# Patient Record
Sex: Female | Born: 1965 | Hispanic: Yes | Marital: Married | Smoking: Never smoker
Health system: Southern US, Community
[De-identification: ages and names within clinical notes are randomized; demographics above are authoritative.]

## PROBLEM LIST (undated history)

## (undated) DIAGNOSIS — K5792 Diverticulitis of intestine, part unspecified, without perforation or abscess without bleeding: Secondary | ICD-10-CM

## (undated) DIAGNOSIS — E119 Type 2 diabetes mellitus without complications: Secondary | ICD-10-CM

## (undated) DIAGNOSIS — K219 Gastro-esophageal reflux disease without esophagitis: Secondary | ICD-10-CM

## (undated) DIAGNOSIS — C801 Malignant (primary) neoplasm, unspecified: Secondary | ICD-10-CM

## (undated) DIAGNOSIS — E0590 Thyrotoxicosis, unspecified without thyrotoxic crisis or storm: Secondary | ICD-10-CM

## (undated) HISTORY — DX: Thyrotoxicosis, unspecified without thyrotoxic crisis or storm: E05.90

## (undated) HISTORY — DX: Diverticulitis of intestine, part unspecified, without perforation or abscess without bleeding: K57.92

## (undated) HISTORY — DX: Gastro-esophageal reflux disease without esophagitis: K21.9

---

## 2014-12-03 ENCOUNTER — Other Ambulatory Visit (HOSPITAL_COMMUNITY): Payer: Self-pay | Admitting: Physician Assistant

## 2014-12-03 DIAGNOSIS — Z1231 Encounter for screening mammogram for malignant neoplasm of breast: Secondary | ICD-10-CM

## 2014-12-03 DIAGNOSIS — N644 Mastodynia: Secondary | ICD-10-CM

## 2014-12-08 ENCOUNTER — Encounter (HOSPITAL_COMMUNITY): Payer: Self-pay

## 2014-12-15 ENCOUNTER — Ambulatory Visit (HOSPITAL_COMMUNITY)
Admission: RE | Admit: 2014-12-15 | Discharge: 2014-12-15 | Disposition: A | Discharge status: Home / Self Care | Payer: BLUE CROSS/BLUE SHIELD | Source: Ambulatory Visit | Attending: Physician Assistant | Admitting: Physician Assistant

## 2014-12-15 ENCOUNTER — Other Ambulatory Visit (HOSPITAL_COMMUNITY): Payer: Self-pay | Admitting: Physician Assistant

## 2014-12-15 DIAGNOSIS — N644 Mastodynia: Secondary | ICD-10-CM

## 2014-12-22 ENCOUNTER — Encounter: Payer: Self-pay | Admitting: Advanced Practice Midwife

## 2014-12-22 ENCOUNTER — Other Ambulatory Visit (HOSPITAL_COMMUNITY)
Admission: RE | Admit: 2014-12-22 | Discharge: 2014-12-22 | Disposition: A | Discharge status: Home / Self Care | Payer: BLUE CROSS/BLUE SHIELD | Source: Ambulatory Visit | Attending: Advanced Practice Midwife | Admitting: Advanced Practice Midwife

## 2014-12-22 ENCOUNTER — Ambulatory Visit (INDEPENDENT_AMBULATORY_CARE_PROVIDER_SITE_OTHER): Payer: BLUE CROSS/BLUE SHIELD | Admitting: Advanced Practice Midwife

## 2014-12-22 VITALS — BP 138/80 | HR 75 | Ht 63.0 in | Wt 161.0 lb

## 2014-12-22 DIAGNOSIS — Z01419 Encounter for gynecological examination (general) (routine) without abnormal findings: Secondary | ICD-10-CM | POA: Diagnosis not present

## 2014-12-22 DIAGNOSIS — Z1151 Encounter for screening for human papillomavirus (HPV): Secondary | ICD-10-CM | POA: Insufficient documentation

## 2014-12-22 NOTE — Progress Notes (Signed)
[facility] 49 y.o.  Filed Vitals:   12/22/14 1549  BP: 138/80  Pulse: 75     Past Medical History: Past Medical History  Diagnosis Date  . Acid reflux     Past Surgical History: History reviewed. No pertinent past surgical history.  Family History: Family History  Problem Relation Age of Onset  . Cancer Father     lung cancer  . Cancer Brother     lung cancer    Social History: Social History  Substance Use Topics  . Smoking status: Never Smoker   . Smokeless tobacco: Never Used  . Alcohol Use: No    Allergies:  Allergies  Allergen Reactions  . Penicillins Itching and Rash      Current outpatient prescriptions:  .  lansoprazole (PREVACID) 15 MG capsule, Take 15 mg by mouth daily at 12 noon., Disp: , Rfl:   History of Present Illness: Here for 1st pap ever!  No kids.  Husband is decent interpreter Had normal mammogram 2 weeks ago.   Still has monthly periods, no c/o  Review of Systems   Patient denies any headaches, blurred vision, shortness of breath, chest pain, abdominal pain, problems with bowel movements, urination, or intercourse.   Physical Exam: General:  Well developed, well nourished, no acute distress Skin:  Warm and dry Neck:  Midline trachea, normal thyroid Lungs; Clear to auscultation bilaterally Breast:  No dominant palpable mass, retraction, or nipple discharge Cardiovascular: Regular rate and rhythm Abdomen:  Soft, non tender, no hepatosplenomegaly Pelvic:  External genitalia is normal in appearance.  The vagina is normal in appearance.  The cervix is smooth and nonfriable  Uterus is felt to be normal size, shape, and contour.  No adnexal masses or tenderness noted.  Extremities:  No swelling or varicosities noted Psych:  No mood changes.     Impression: normal GYN exam     Plan: If normal, q 3 years Mammograms yearly

## 2014-12-22 NOTE — Addendum Note (Signed)
Addended by: Traci Sermon A on: 12/22/2014 05:06 PM   Modules accepted: Orders

## 2014-12-24 LAB — CYTOLOGY - PAP

## 2015-06-17 ENCOUNTER — Encounter (HOSPITAL_COMMUNITY): Payer: Self-pay | Admitting: Emergency Medicine

## 2015-06-17 ENCOUNTER — Emergency Department (HOSPITAL_COMMUNITY)
Admission: EM | Admit: 2015-06-17 | Discharge: 2015-06-17 | Disposition: A | Discharge status: Home / Self Care | Payer: BLUE CROSS/BLUE SHIELD | Attending: Emergency Medicine | Admitting: Emergency Medicine

## 2015-06-17 ENCOUNTER — Emergency Department (HOSPITAL_COMMUNITY): Payer: BLUE CROSS/BLUE SHIELD

## 2015-06-17 ENCOUNTER — Other Ambulatory Visit: Payer: Self-pay

## 2015-06-17 DIAGNOSIS — R079 Chest pain, unspecified: Secondary | ICD-10-CM | POA: Diagnosis present

## 2015-06-17 DIAGNOSIS — J40 Bronchitis, not specified as acute or chronic: Secondary | ICD-10-CM | POA: Diagnosis not present

## 2015-06-17 LAB — BASIC METABOLIC PANEL
Anion gap: 8 (ref 5–15)
BUN: 10 mg/dL (ref 6–20)
CALCIUM: 8.5 mg/dL — AB (ref 8.9–10.3)
CO2: 26 mmol/L (ref 22–32)
CREATININE: 0.78 mg/dL (ref 0.44–1.00)
Chloride: 102 mmol/L (ref 101–111)
Glucose, Bld: 197 mg/dL — ABNORMAL HIGH (ref 65–99)
Potassium: 3.6 mmol/L (ref 3.5–5.1)
SODIUM: 136 mmol/L (ref 135–145)

## 2015-06-17 LAB — HEPATIC FUNCTION PANEL
ALBUMIN: 4.1 g/dL (ref 3.5–5.0)
ALT: 18 U/L (ref 14–54)
AST: 20 U/L (ref 15–41)
Alkaline Phosphatase: 55 U/L (ref 38–126)
BILIRUBIN TOTAL: 0.4 mg/dL (ref 0.3–1.2)
Bilirubin, Direct: <0.1 mg/dL — ABNORMAL LOW (ref 0.1–0.5)
TOTAL PROTEIN: 7.5 g/dL (ref 6.5–8.1)

## 2015-06-17 LAB — LIPASE, BLOOD: LIPASE: 36 U/L (ref 11–51)

## 2015-06-17 LAB — CBC
HCT: 44.2 % (ref 36.0–46.0)
Hemoglobin: 15.4 g/dL — ABNORMAL HIGH (ref 12.0–15.0)
MCH: 30.2 pg (ref 26.0–34.0)
MCHC: 34.8 g/dL (ref 30.0–36.0)
MCV: 86.7 fL (ref 78.0–100.0)
PLATELETS: 272 10*3/uL (ref 150–400)
RBC: 5.1 MIL/uL (ref 3.87–5.11)
RDW: 13.7 % (ref 11.5–15.5)
WBC: 8 10*3/uL (ref 4.0–10.5)

## 2015-06-17 LAB — TROPONIN I

## 2015-06-17 MED ORDER — AZITHROMYCIN 250 MG PO TABS
500.0000 mg | ORAL_TABLET | Freq: Once | ORAL | Status: AC
  Administered 2015-06-17: 500 mg via ORAL
  Filled 2015-06-17: qty 2

## 2015-06-17 MED ORDER — AZITHROMYCIN 250 MG PO TABS: ORAL_TABLET | ORAL | Status: DC

## 2015-06-17 MED ORDER — HYDROCODONE-ACETAMINOPHEN 5-325 MG PO TABS
1.0000 | ORAL_TABLET | Freq: Once | ORAL | Status: AC
  Administered 2015-06-17: 1 via ORAL
  Filled 2015-06-17: qty 1

## 2015-06-17 MED ORDER — HYDROCODONE-ACETAMINOPHEN 5-325 MG PO TABS: 1.0000 | ORAL_TABLET | Freq: Four times a day (QID) | ORAL | Status: DC | PRN

## 2015-06-17 NOTE — Discharge Instructions (Signed)
Follow up next week if not improving. °

## 2015-06-17 NOTE — ED Notes (Signed)
Pt alert & oriented x4, stable gait. Patient given discharge instructions, paperwork & prescription(s). Patient instructed to stop at the registration desk to finish any additional paperwork. Patient informed not to drive, operate any equipment & handel any important documents 4 hours after taking pain medication. Patient verbalized understanding. Pt left department w/ no further questions. 

## 2015-06-17 NOTE — ED Notes (Signed)
Patient complaining of "squeezing" feeling in the middle of her chest for 1 week. Patient does not speak [place] and has spouse with her to translate. Spouse states patient has been coughing starting this week.

## 2015-06-18 NOTE — ED Provider Notes (Signed)
CSN: [id number]     Arrival date & time 06/17/15  1903 History   First MD Initiated Contact with Patient 06/17/15 1956     Chief Complaint  Patient presents with  . Chest Pain     (Consider location/radiation/quality/duration/timing/severity/associated sxs/prior Treatment) Patient is a 50 y.o. female presenting with chest pain. The history is provided by the patient (Patient complains of a cough for a few days and some chest tightness).  Chest Pain Pain location:  L chest Pain quality: aching   Pain radiates to:  Does not radiate Pain radiates to the back: no   Pain severity:  Mild Onset quality:  Gradual Timing:  Intermittent Chronicity:  New Context: not breathing   Associated symptoms: cough   Associated symptoms: no abdominal pain, no back pain, no fatigue and no headache     Past Medical History  Diagnosis Date  . Acid reflux    History reviewed. No pertinent past surgical history. Family History  Problem Relation Age of Onset  . Cancer Father     lung cancer  . Cancer Brother     lung cancer   Social History  Substance Use Topics  . Smoking status: Never Smoker   . Smokeless tobacco: Never Used  . Alcohol Use: No   OB History    No data available     Review of Systems  Constitutional: Negative for appetite change and fatigue.  HENT: Negative for congestion, ear discharge and sinus pressure.   Eyes: Negative for discharge.  Respiratory: Positive for cough.   Cardiovascular: Positive for chest pain.  Gastrointestinal: Negative for abdominal pain and diarrhea.  Genitourinary: Negative for frequency and hematuria.  Musculoskeletal: Negative for back pain.  Skin: Negative for rash.  Neurological: Negative for seizures and headaches.  Psychiatric/Behavioral: Negative for hallucinations.      Allergies  Penicillins  Home Medications   Prior to Admission medications   Medication Sig Start Date End Date Taking? Authorizing Provider  azithromycin  (ZITHROMAX) 250 MG tablet Take one tablet a day 06/17/15   Milton Ferguson, MD  HYDROcodone-acetaminophen (NORCO/VICODIN) 5-325 MG tablet Take 1 tablet by mouth every 6 (six) hours as needed for moderate pain. 06/17/15   Milton Ferguson, MD   BP 112/72 mmHg  Pulse 85  Temp(Src) 99.6 F (37.6 C) (Oral)  Resp 19  Ht 5\' 3"  (1.6 m)  Wt 161 lb (73.029 kg)  BMI 28.53 kg/m2  SpO2 95%  LMP 06/07/2015 Physical Exam  Constitutional: She is oriented to person, place, and time. She appears well-developed.  HENT:  Head: Normocephalic.  Eyes: Conjunctivae and EOM are normal. No scleral icterus.  Neck: Neck supple. No thyromegaly present.  Cardiovascular: Normal rate and regular rhythm.  Exam reveals no gallop and no friction rub.   No murmur heard. Pulmonary/Chest: No stridor. She has no wheezes. She has no rales. She exhibits no tenderness.  Abdominal: She exhibits no distension. There is no tenderness. There is no rebound.  Musculoskeletal: Normal range of motion. She exhibits no edema.  Lymphadenopathy:    She has no cervical adenopathy.  Neurological: She is oriented to person, place, and time. She exhibits normal muscle tone. Coordination normal.  Skin: No rash noted. No erythema.  Psychiatric: She has a normal mood and affect. Her behavior is normal.    ED Course  Procedures (including critical care time) Labs Review Labs Reviewed  CBC - Abnormal; Notable for the following:    Hemoglobin 15.4 (*)    All other  components within normal limits  BASIC METABOLIC PANEL - Abnormal; Notable for the following:    Glucose, Bld 197 (*)    Calcium 8.5 (*)    All other components within normal limits  HEPATIC FUNCTION PANEL - Abnormal; Notable for the following:    Bilirubin, Direct <0.1 (*)    All other components within normal limits  TROPONIN I  LIPASE, BLOOD    Imaging Review Dg Chest 2 View  06/17/2015  CLINICAL DATA:  Chest pain EXAM: CHEST  2 VIEW COMPARISON:  None. FINDINGS: Normal  heart size. Normal mediastinal contour. No pneumothorax. No pleural effusion. Lungs appear clear, with no acute consolidative airspace disease and no pulmonary edema. IMPRESSION: No active cardiopulmonary disease. Electronically Signed   By: Ilona Sorrel M.D.   On: 06/17/2015 19:59   I have personally reviewed and evaluated these images and lab results as part of my medical decision-making.   EKG Interpretation   Date/Time:  Thursday June 17 2015 20:05:22 EDT Ventricular Rate:  91 PR Interval:  152 QRS Duration: 76 QT Interval:  346 QTC Calculation: 426 R Axis:   -48 Text Interpretation:  Sinus rhythm Left axis deviation Baseline wander in  lead(s) V3 Confirmed by Vaughn Frieze  MD, Caliya Narine ([id number]) on 06/17/2015 10:01:29  PM      MDM   Final diagnoses:  Bronchitis    Labs unremarkable. Suspect chest pain is related to bronchitis. Patient put on Z-Pak and will follow-up as needed    Milton Ferguson, MD 06/18/15 2137

## 2018-10-02 ENCOUNTER — Other Ambulatory Visit: Payer: Self-pay

## 2018-10-02 ENCOUNTER — Other Ambulatory Visit: Payer: BLUE CROSS/BLUE SHIELD

## 2018-10-02 DIAGNOSIS — R6889 Other general symptoms and signs: Secondary | ICD-10-CM | POA: Diagnosis not present

## 2018-10-02 DIAGNOSIS — Z20822 Contact with and (suspected) exposure to covid-19: Secondary | ICD-10-CM

## 2018-10-06 LAB — NOVEL CORONAVIRUS, NAA: SARS-CoV-2, NAA: NOT DETECTED

## 2018-10-10 ENCOUNTER — Telehealth: Payer: Self-pay | Admitting: Physician Assistant

## 2018-10-10 NOTE — Telephone Encounter (Signed)
Pt aware covid lab test negative, not detected  Pt would like the generic letter about her covid test results mailed to her home  [place] [id number]

## 2018-10-10 NOTE — Telephone Encounter (Signed)
Results will be mailed to address as requested.

## 2019-07-25 DIAGNOSIS — G44229 Chronic tension-type headache, not intractable: Secondary | ICD-10-CM | POA: Diagnosis not present

## 2019-07-25 DIAGNOSIS — E663 Overweight: Secondary | ICD-10-CM | POA: Diagnosis not present

## 2019-07-25 DIAGNOSIS — Z6828 Body mass index (BMI) 28.0-28.9, adult: Secondary | ICD-10-CM | POA: Diagnosis not present

## 2019-07-25 DIAGNOSIS — Z0001 Encounter for general adult medical examination with abnormal findings: Secondary | ICD-10-CM | POA: Diagnosis not present

## 2019-08-27 DIAGNOSIS — R42 Dizziness and giddiness: Secondary | ICD-10-CM | POA: Diagnosis not present

## 2019-08-27 DIAGNOSIS — Z0001 Encounter for general adult medical examination with abnormal findings: Secondary | ICD-10-CM | POA: Diagnosis not present

## 2019-08-27 DIAGNOSIS — E663 Overweight: Secondary | ICD-10-CM | POA: Diagnosis not present

## 2019-08-27 DIAGNOSIS — Z6828 Body mass index (BMI) 28.0-28.9, adult: Secondary | ICD-10-CM | POA: Diagnosis not present

## 2019-09-05 DIAGNOSIS — E119 Type 2 diabetes mellitus without complications: Secondary | ICD-10-CM | POA: Diagnosis not present

## 2019-09-05 DIAGNOSIS — Z124 Encounter for screening for malignant neoplasm of cervix: Secondary | ICD-10-CM | POA: Diagnosis not present

## 2019-09-05 DIAGNOSIS — Z01411 Encounter for gynecological examination (general) (routine) with abnormal findings: Secondary | ICD-10-CM | POA: Diagnosis not present

## 2019-12-18 DIAGNOSIS — E7849 Other hyperlipidemia: Secondary | ICD-10-CM | POA: Diagnosis not present

## 2019-12-18 DIAGNOSIS — Z6827 Body mass index (BMI) 27.0-27.9, adult: Secondary | ICD-10-CM | POA: Diagnosis not present

## 2019-12-18 DIAGNOSIS — E119 Type 2 diabetes mellitus without complications: Secondary | ICD-10-CM | POA: Diagnosis not present

## 2020-02-06 DIAGNOSIS — E7849 Other hyperlipidemia: Secondary | ICD-10-CM | POA: Diagnosis not present

## 2020-02-06 DIAGNOSIS — E119 Type 2 diabetes mellitus without complications: Secondary | ICD-10-CM | POA: Diagnosis not present

## 2020-02-06 DIAGNOSIS — Z6827 Body mass index (BMI) 27.0-27.9, adult: Secondary | ICD-10-CM | POA: Diagnosis not present

## 2020-03-26 DIAGNOSIS — U071 COVID-19: Secondary | ICD-10-CM | POA: Diagnosis not present

## 2020-03-26 DIAGNOSIS — Z20822 Contact with and (suspected) exposure to covid-19: Secondary | ICD-10-CM | POA: Diagnosis not present

## 2020-04-20 ENCOUNTER — Other Ambulatory Visit: Payer: Self-pay

## 2020-04-20 ENCOUNTER — Ambulatory Visit: Admission: EM | Admit: 2020-04-20 | Discharge: 2020-04-20 | Discharge status: Absent without leave | Payer: Self-pay

## 2020-04-21 DIAGNOSIS — Z1331 Encounter for screening for depression: Secondary | ICD-10-CM | POA: Diagnosis not present

## 2020-04-21 DIAGNOSIS — Z6825 Body mass index (BMI) 25.0-25.9, adult: Secondary | ICD-10-CM | POA: Diagnosis not present

## 2020-04-21 DIAGNOSIS — R197 Diarrhea, unspecified: Secondary | ICD-10-CM | POA: Diagnosis not present

## 2020-04-21 DIAGNOSIS — K529 Noninfective gastroenteritis and colitis, unspecified: Secondary | ICD-10-CM | POA: Diagnosis not present

## 2020-04-21 DIAGNOSIS — E663 Overweight: Secondary | ICD-10-CM | POA: Diagnosis not present

## 2020-05-10 ENCOUNTER — Encounter: Payer: Self-pay | Admitting: Internal Medicine

## 2020-05-21 DIAGNOSIS — Z6824 Body mass index (BMI) 24.0-24.9, adult: Secondary | ICD-10-CM | POA: Diagnosis not present

## 2020-05-21 DIAGNOSIS — E7849 Other hyperlipidemia: Secondary | ICD-10-CM | POA: Diagnosis not present

## 2020-05-21 DIAGNOSIS — K529 Noninfective gastroenteritis and colitis, unspecified: Secondary | ICD-10-CM | POA: Diagnosis not present

## 2020-05-21 DIAGNOSIS — E119 Type 2 diabetes mellitus without complications: Secondary | ICD-10-CM | POA: Diagnosis not present

## 2020-05-21 DIAGNOSIS — R12 Heartburn: Secondary | ICD-10-CM | POA: Diagnosis not present

## 2020-05-21 DIAGNOSIS — E739 Lactose intolerance, unspecified: Secondary | ICD-10-CM | POA: Diagnosis not present

## 2020-06-11 DIAGNOSIS — Z6823 Body mass index (BMI) 23.0-23.9, adult: Secondary | ICD-10-CM | POA: Diagnosis not present

## 2020-06-11 DIAGNOSIS — R1012 Left upper quadrant pain: Secondary | ICD-10-CM | POA: Diagnosis not present

## 2020-06-11 DIAGNOSIS — R634 Abnormal weight loss: Secondary | ICD-10-CM | POA: Diagnosis not present

## 2020-06-11 DIAGNOSIS — R195 Other fecal abnormalities: Secondary | ICD-10-CM | POA: Diagnosis not present

## 2020-06-15 ENCOUNTER — Other Ambulatory Visit: Payer: Self-pay

## 2020-06-15 ENCOUNTER — Encounter (HOSPITAL_COMMUNITY): Payer: Self-pay | Admitting: *Deleted

## 2020-06-15 ENCOUNTER — Emergency Department (HOSPITAL_COMMUNITY): Payer: BC Managed Care – PPO

## 2020-06-15 ENCOUNTER — Emergency Department (HOSPITAL_COMMUNITY)
Admission: EM | Admit: 2020-06-15 | Discharge: 2020-06-15 | Disposition: A | Discharge status: Home / Self Care | Payer: BC Managed Care – PPO | Attending: Emergency Medicine | Admitting: Emergency Medicine

## 2020-06-15 DIAGNOSIS — R109 Unspecified abdominal pain: Secondary | ICD-10-CM | POA: Diagnosis not present

## 2020-06-15 DIAGNOSIS — E119 Type 2 diabetes mellitus without complications: Secondary | ICD-10-CM | POA: Insufficient documentation

## 2020-06-15 DIAGNOSIS — K5792 Diverticulitis of intestine, part unspecified, without perforation or abscess without bleeding: Secondary | ICD-10-CM | POA: Diagnosis not present

## 2020-06-15 HISTORY — DX: Type 2 diabetes mellitus without complications: E11.9

## 2020-06-15 LAB — COMPREHENSIVE METABOLIC PANEL
ALT: 19 U/L (ref 0–44)
AST: 13 U/L — ABNORMAL LOW (ref 15–41)
Albumin: 3.9 g/dL (ref 3.5–5.0)
Alkaline Phosphatase: 85 U/L (ref 38–126)
Anion gap: 10 (ref 5–15)
BUN: 12 mg/dL (ref 6–20)
CO2: 26 mmol/L (ref 22–32)
Calcium: 10.6 mg/dL — ABNORMAL HIGH (ref 8.9–10.3)
Chloride: 105 mmol/L (ref 98–111)
Creatinine, Ser: 0.38 mg/dL — ABNORMAL LOW (ref 0.44–1.00)
GFR, Estimated: >60 mL/min (ref >60)
Glucose, Bld: 121 mg/dL — ABNORMAL HIGH (ref 70–99)
Potassium: 3.5 mmol/L (ref 3.5–5.1)
Sodium: 141 mmol/L (ref 135–145)
Total Bilirubin: 0.5 mg/dL (ref 0.3–1.2)
Total Protein: 7.5 g/dL (ref 6.5–8.1)

## 2020-06-15 LAB — CBC WITH DIFFERENTIAL/PLATELET
Abs Immature Granulocytes: 0.04 10*3/uL (ref 0.00–0.07)
Basophils Absolute: 0 10*3/uL (ref 0.0–0.1)
Basophils Relative: 0 %
Eosinophils Absolute: 0.3 10*3/uL (ref 0.0–0.5)
Eosinophils Relative: 2 %
HCT: 41.5 % (ref 36.0–46.0)
Hemoglobin: 14 g/dL (ref 12.0–15.0)
Immature Granulocytes: 0 %
Lymphocytes Relative: 22 %
Lymphs Abs: 2.9 10*3/uL (ref 0.7–4.0)
MCH: 27.7 pg (ref 26.0–34.0)
MCHC: 33.7 g/dL (ref 30.0–36.0)
MCV: 82 fL (ref 80.0–100.0)
Monocytes Absolute: 1.1 10*3/uL — ABNORMAL HIGH (ref 0.1–1.0)
Monocytes Relative: 8 %
Neutro Abs: 8.4 10*3/uL — ABNORMAL HIGH (ref 1.7–7.7)
Neutrophils Relative %: 68 %
Platelets: 298 10*3/uL (ref 150–400)
RBC: 5.06 MIL/uL (ref 3.87–5.11)
RDW: 13 % (ref 11.5–15.5)
WBC: 12.7 10*3/uL — ABNORMAL HIGH (ref 4.0–10.5)
nRBC: 0 % (ref 0.0–0.2)

## 2020-06-15 LAB — URINALYSIS, ROUTINE W REFLEX MICROSCOPIC
Bilirubin Urine: NEGATIVE
Glucose, UA: NEGATIVE mg/dL
Ketones, ur: NEGATIVE mg/dL
Leukocytes,Ua: NEGATIVE
Nitrite: NEGATIVE
Protein, ur: NEGATIVE mg/dL
Specific Gravity, Urine: 1.005 (ref 1.005–1.030)
pH: 8 (ref 5.0–8.0)

## 2020-06-15 LAB — LIPASE, BLOOD: Lipase: 32 U/L (ref 11–51)

## 2020-06-15 MED ORDER — SODIUM CHLORIDE 0.9 % IV BOLUS
500.0000 mL | Freq: Once | INTRAVENOUS | Status: AC
  Administered 2020-06-15: 500 mL via INTRAVENOUS

## 2020-06-15 MED ORDER — CEFDINIR 300 MG PO CAPS: 300.0000 mg | ORAL_CAPSULE | Freq: Two times a day (BID) | ORAL | 0 refills | Status: DC

## 2020-06-15 MED ORDER — IOHEXOL 300 MG/ML  SOLN
100.0000 mL | Freq: Once | INTRAMUSCULAR | Status: AC | PRN
  Administered 2020-06-15: 100 mL via INTRAVENOUS

## 2020-06-15 MED ORDER — METRONIDAZOLE 500 MG PO TABS: 500.0000 mg | ORAL_TABLET | Freq: Two times a day (BID) | ORAL | 0 refills | Status: DC

## 2020-06-15 NOTE — Discharge Instructions (Addendum)
There is also a cyst on her ovary.  Follow-up in 6 to 12 months for another ultrasound.  Diverticulitis showed up on the CAT scan.  Take the new antibiotics and follow-up with gastroenterology as planned

## 2020-06-15 NOTE — ED Notes (Signed)
Discharge instructions reviewed with patient, interpreter [facility] [phone]. Pt verbalized understanding.

## 2020-06-15 NOTE — ED Triage Notes (Signed)
Interpreter used # [id number], Juan  Pt c/o diarrhea and LLQ abdominal pain that started 2 months ago. Pt reports she was placed on medication but it hasn't helped. Pt also reports 28lb weight loss since this started.

## 2020-06-15 NOTE — ED Provider Notes (Signed)
[facility] Provider Note   CSN: [id number] Arrival date & time: 06/15/20  0102     History Chief Complaint  Patient presents with  . Abdominal Pain    Betty Ellis is a 55 y.o. female.  HPI Patient speaks Spanish and Child psychotherapist was used. Patient presents with abdominal pain.  Has had for the last 3 months.  Started with just diarrhea and constipation at times for about a month and then for the last 2 months has had abdominal pain.  Left lower quadrant pain.  Whenever she eats she states she will have quick bowel movement after.  States she is down around 28 pounds since December.  Has seen her PCP and been on Cipro Flagyl and then Bentyl.  States that she did have Covid back in January.    Past Medical History:  Diagnosis Date  . Acid reflux   . Diabetes mellitus without complication ([place])     There are no problems to display for this patient.   History reviewed. No pertinent surgical history.   OB History   No obstetric history on file.     Family History  Problem Relation Age of Onset  . Cancer Father        lung cancer  . Cancer Brother        lung cancer    Social History   Tobacco Use  . Smoking status: Never Smoker  . Smokeless tobacco: Never Used  Vaping Use  . Vaping Use: Never used  Substance Use Topics  . Alcohol use: No  . Drug use: No    Home Medications Prior to Admission medications   Medication Sig Start Date End Date Taking? Authorizing Provider  cefdinir (OMNICEF) 300 MG capsule Take 1 capsule (300 mg total) by mouth 2 (two) times daily. 06/15/20  Yes Davonna Belling, MD  dicyclomine (BENTYL) 20 MG tablet Take 20 mg by mouth 4 (four) times daily. 06/11/20  Yes [provider]  metroNIDAZOLE (FLAGYL) 500 MG tablet Take 1 tablet (500 mg total) by mouth 2 (two) times daily. 06/15/20  Yes Davonna Belling, MD  HYDROcodone-acetaminophen (NORCO/VICODIN) 5-325 MG tablet Take 1 tablet by mouth every 6  (six) hours as needed for moderate pain. Patient not taking: Reported on 06/15/2020 06/17/15   Milton Ferguson, MD    Allergies    Fish allergy and Penicillins  Review of Systems   Review of Systems  Constitutional: Positive for appetite change and unexpected weight change.  HENT: Negative for congestion.   Respiratory: Negative for shortness of breath.   Cardiovascular: Negative for chest pain.  Gastrointestinal: Positive for abdominal pain and diarrhea. Negative for nausea and vomiting.  Genitourinary: Negative for flank pain.  Musculoskeletal: Negative for back pain.  Skin: Negative for wound.  Neurological: Negative for weakness.  Psychiatric/Behavioral: Negative for confusion.    Physical Exam Updated Vital Signs BP (!) 141/68 (BP Location: Left Arm)   Pulse 74   Temp 98 F (36.7 C) (Oral)   Resp 18   Ht 5\' 3"  (1.6 m)   Wt 60.3 kg   LMP 06/07/2015   SpO2 98%   BMI 23.56 kg/m   Physical Exam Vitals and nursing note reviewed.  HENT:     Head: Normocephalic.  Eyes:     General: No scleral icterus. Cardiovascular:     Rate and Rhythm: Normal rate.  Pulmonary:     Breath sounds: No wheezing or rhonchi.  Abdominal:     Tenderness: There  is abdominal tenderness.     Comments: Moderate left lower quadrant tenderness without hernia palpated.  Skin:    General: Skin is warm.     Capillary Refill: Capillary refill takes less than 2 seconds.  Neurological:     Mental Status: She is alert and oriented to person, place, and time.  Psychiatric:        Mood and Affect: Mood normal.     ED Results / Procedures / Treatments   Labs (all labs ordered are listed, but only abnormal results are displayed) Labs Reviewed  COMPREHENSIVE METABOLIC PANEL - Abnormal; Notable for the following components:      Result Value   Glucose, Bld 121 (*)    Creatinine, Ser 0.38 (*)    Calcium 10.6 (*)    AST 13 (*)    All other components within normal limits  CBC WITH  DIFFERENTIAL/PLATELET - Abnormal; Notable for the following components:   WBC 12.7 (*)    Neutro Abs 8.4 (*)    Monocytes Absolute 1.1 (*)    All other components within normal limits  URINALYSIS, ROUTINE W REFLEX MICROSCOPIC - Abnormal; Notable for the following components:   Color, Urine STRAW (*)    APPearance HAZY (*)    Hgb urine dipstick SMALL (*)    Bacteria, UA RARE (*)    All other components within normal limits  LIPASE, BLOOD    EKG None  Radiology CT ABDOMEN PELVIS W CONTRAST  Result Date: 06/15/2020 CLINICAL DATA:  Lower abdominal pain for 2 months with diarrhea. Left lower quadrant pain EXAM: CT ABDOMEN AND PELVIS WITH CONTRAST TECHNIQUE: Multidetector CT imaging of the abdomen and pelvis was performed using the standard protocol following bolus administration of intravenous contrast. CONTRAST:  156mL OMNIPAQUE IOHEXOL 300 MG/ML  SOLN COMPARISON:  None. FINDINGS: Lower chest:  No contributory findings. Hepatobiliary: No focal liver abnormality.No evidence of biliary obstruction or stone. Pancreas: Unremarkable. Spleen: Unremarkable. Adrenals/Urinary Tract: Negative adrenals. No hydronephrosis or stone. Unremarkable bladder. Stomach/Bowel: Distal colonic diverticulosis with mild inflammation around a distal descending sigmoid junction diverticulum which projects superiorly and may have mucosal hyperenhancement. No abscess. Vascular/Lymphatic: No acute vascular abnormality. No mass or adenopathy. Reproductive:3.5 cm right ovarian cystic density. Fatty mass in the left ovary measuring 1 cm, consistent with dermoid Other: No ascites or pneumoperitoneum. Musculoskeletal: No acute abnormalities. IMPRESSION: 1. Mild diverticulitis of the distal descending colon. 2. 3.5 cm right ovarian cyst. Assuming postmenopausal status, recommend follow-up US in 6-12 months. note: This recommendation does not apply to premenarchal patients and to those with increased risk (genetic, family history,  elevated tumor markers or other high-risk factors) of ovarian cancer. Reference: JACR 2020 Feb; 17(2):248-254 3. 1 cm left ovarian dermoid. Electronically Signed   By: Monte Fantasia M.D.   On: 06/15/2020 09:16    Procedures Procedures   Medications Ordered in ED Medications  sodium chloride 0.9 % bolus 500 mL (0 mLs Intravenous Stopped 06/15/20 1002)  iohexol (OMNIPAQUE) 300 MG/ML solution 100 mL (100 mLs Intravenous Contrast Given 06/15/20 0856)    ED Course  I have reviewed the triage vital signs and the nursing notes.  Pertinent labs & imaging results that were available during my care of the patient were reviewed by me and considered in my medical decision making (see chart for details).    MDM Rules/Calculators/A&P                          Patient  with abdominal pain.  Has had for months now.  Has lost weight with it.  Had been on Cipro and Flagyl without relief.  Lab work reassuring.  CT scan done and showed diverticulitis and incidental ovarian cyst.  Instructed to follow-up on the ovarian cyst.  I think patient is stable for outpatient follow-up.  In fact has GI follow-up in 2 days.  Discussed with pharmacy since patient has already been on Cipro Flagyl and has a penicillin allergy will do cefdinir and Flagyl.  Discharge home. Final Clinical Impression(s) / ED Diagnoses Final diagnoses:  Diverticulitis    Rx / DC Orders ED Discharge Orders         Ordered    cefdinir (OMNICEF) 300 MG capsule  2 times daily        06/15/20 1126    metroNIDAZOLE (FLAGYL) 500 MG tablet  2 times daily        06/15/20 1126           Davonna Belling, MD 06/16/20 [phone]

## 2020-06-17 ENCOUNTER — Ambulatory Visit (INDEPENDENT_AMBULATORY_CARE_PROVIDER_SITE_OTHER): Payer: BC Managed Care – PPO | Admitting: Nurse Practitioner

## 2020-06-17 ENCOUNTER — Encounter: Payer: Self-pay | Admitting: Nurse Practitioner

## 2020-06-17 ENCOUNTER — Other Ambulatory Visit: Payer: Self-pay

## 2020-06-17 DIAGNOSIS — K5900 Constipation, unspecified: Secondary | ICD-10-CM

## 2020-06-17 DIAGNOSIS — K5732 Diverticulitis of large intestine without perforation or abscess without bleeding: Secondary | ICD-10-CM | POA: Diagnosis not present

## 2020-06-17 DIAGNOSIS — R110 Nausea: Secondary | ICD-10-CM | POA: Diagnosis not present

## 2020-06-17 DIAGNOSIS — R109 Unspecified abdominal pain: Secondary | ICD-10-CM | POA: Insufficient documentation

## 2020-06-17 DIAGNOSIS — R1032 Left lower quadrant pain: Secondary | ICD-10-CM

## 2020-06-17 DIAGNOSIS — R634 Abnormal weight loss: Secondary | ICD-10-CM

## 2020-06-17 DIAGNOSIS — R197 Diarrhea, unspecified: Secondary | ICD-10-CM

## 2020-06-17 MED ORDER — ONDANSETRON HCL 4 MG PO TABS: 4.0000 mg | ORAL_TABLET | Freq: Four times a day (QID) | ORAL | 2 refills | Status: DC | PRN

## 2020-06-17 NOTE — Patient Instructions (Signed)
English:  Your health issues we discussed today were:   Diverticulitis with abdominal pain and diarrhea: 1. I am glad you are feeling better! 2. Finish all of your antibiotics 3. Call us if you have any worsening symptoms  Nausea: 1. I have sent a prescription for Zofran to your pharmacy 2. You can take this every 6 hours, if needed, for nausea 3. Call us if your symptoms get worse  Constipation: 1. Continue to use Metamucil 2. Call us if your constipation becomes worse  GERD (reflux/heartburn): 1. Continue using your acid pill to help with your reflux/heartburn 2. Call us if your symptoms become worse  Need for a colonoscopy: 1. As we discussed, you will need a colonoscopy 2. We will help schedule your colonoscopy for you 3. We will make more recommendations after your colonoscopy  Overall I recommend:  1. Continue your other current medications 2. Return for follow-up in 3 months 3. Call us if you have any questions or concerns   ---------------------------------------------------------------  I am glad you have gotten your COVID-19 vaccination!  Even though you are fully vaccinated you should continue to follow CDC and state/local guidelines.  ---------------------------------------------------------------  At [facility] we value your feedback. You may receive a survey about your visit today. Please share your experience as we strive to create trusting relationships with our patients to provide genuine, compassionate, quality care.  We appreciate your understanding and patience as we review any laboratory studies, imaging, and other diagnostic tests that are ordered as we care for you. Our office policy is 5 business days for review of these results, and any emergent or urgent results are addressed in a timely manner for your best interest. If you do not hear from our office in 1 week, please contact us.   We also encourage the use of MyChart, which  contains your medical information for your review as well. If you are not enrolled in this feature, an access code is on this after visit summary for your convenience. Thank you for allowing [place] to be involved in your care.  It was great to see you today!  I hope you have a great spring!!     Espanol:  Sus problemas de salud que discutimos hoy fueron:  Diverticulitis con dolor abdominal y diarrea: 1. Me alegro de que te sientas mejor! 2. Termine todos sus antibiticos 3. Llmenos si tiene algn sntoma que empeora  Nusea: 1. He enviado una receta de Zofran a su farmacia. 2. Puede tomar esto cada 6 horas, si es necesario, para las nuseas. 3. Llmenos si sus sntomas empeoran  Estreimiento: 1. Siga usando Metamucil 2. Llmenos si su estreimiento empeora  ERGE (reflujo/acidez estomacal): 1. Contine usando su pldora de cido para ayudar con su reflujo/acidez estomacal 2. Llmenos si sus sntomas empeoran  Necesidad de una colonoscopia: 1. Como comentamos, necesitar una colonoscopia 2. Le ayudaremos a programar su colonoscopia 3. Haremos ms recomendaciones despus de su colonoscopia.  En general recomiendo: 1. Contine con sus otros medicamentos actuales 2. Regreso para seguimiento en 3 meses 3. Llmenos si tiene alguna pregunta o inquietud.    ---------------------------------------------------------------  Me alegro de que haya recibido su vacuna COVID-19! Aunque est completamente vacunado, debe continuar siguiendo las pautas estatales/locales y [employer].  ---------------------------------------------------------------  [facility] valoramos sus comentarios. Es posible que reciba una encuesta sobre su visita hoy. Comparta su experiencia mientras nos esforzamos por crear relaciones de confianza con nuestros pacientes para brindar atencin [place], [place] y  de calidad.  Agradecemos su comprensin y [employer] estudios  de laboratorio, las imgenes y Scientist, research (medical) pruebas de diagnstico que se solicitan mientras lo cuidamos. Nuestra poltica de oficina es de 5 das hbiles para la revisin de [place], y cualquier resultado emergente o urgente se aborda de manera oportuna para su mejor inters. Si no recibe noticias de nuestra oficina en 1 semana, comunquese con nosotros.  Tambin recomendamos el uso de MyChart, que tambin contiene su informacin mdica para su revisin. Si no est inscrito en esta funcin, hay un cdigo de acceso en este resumen posterior a la visita para su conveniencia. Gracias por permitirnos estar involucrados en su cuidado.  Fue genial verte hoy! Espero que tengis una gran primavera!!

## 2020-06-17 NOTE — Progress Notes (Signed)
Primary Care Physician:  Scherrie Bateman Primary Gastroenterologist:  Dr. Gala Romney  Chief Complaint  Patient presents with  . Diverticulitis    Diagnosed 3/29 by ED. No abd pain. Currently on ABX from ER.  . change in bowels    Was having a lot of diarrhea several times a day. Now not so much. Feels maybe constipated. Strains at times with BM, BM's 1-3 times per day, no bleeding  . loss of weight    Had diarrhea for a while and everything she ate went through her. Lost 32lbs since december    HPI:   Betty Ellis is a 55 y.o. female who presents on referral from primary care for diarrhea and to schedule a colonoscopy.  Reviewed information provided with referral including PCP office visit dated 04/21/2020.  That time patient was having diarrhea and abdominal pain for about 1 month.  Recommend CBC, CMP, stool studies.  Started on Cipro and referred to GI.  Reviewed labs provided by primary care drawn 04/21/2020 which found normal CBC essentially normal CMP.  Follow-up primary care office visit 05/21/2020 noted losing weight and continued diarrhea.  Recommended referral to GI.  She did recently also have COVID-19.  Unable to tolerate milk products.  She was started on Cipro and Flagyl at that time.  She was seen in emergency department 06/15/2020 for abdominal pain for the past 3 months.  She started with diarrhea and constipation at times per month that has been ongoing.  Postprandial fecal urgency.  Weight loss of 20 pounds subjectively.  CBC did find mild leukocytosis of white blood cell count 12.7, normal hemoglobin.  CT abdomen and pelvis found diverticulitis and incidental ovarian cyst.  Recommend follow-up on ovarian cyst as outpatient.  Because of recent Cipro Flagyl has changed her to cefdinir and Flagyl, discharged home in satisfactory condition.  No history of colonoscopy found in our system.  Today states doing okay overall. She is Spanish speaking and accompanied by an  interpreter. Abdominal pain improved on new antibiotics, only hurts if she presses on it now. Has been having diarrhea though this is improved currently. Having 3-4 bowel movements a day, has normalized in consistency in the past couple days. She has lost about 32 lbs since December. Appetite is minimal (eats small portions) because she gets full early. She has diabetes for about a year. Blood sugars are about 120s-130s, not requirng medication at this time. Has nausea ongoing, thinks its due to her antibiotics even though taking it with food. No vomiting. Would like something for nausea. Denies hematochezia, melena, fever, chills. Denies URI or flu-like symptoms. Denies loss of sense of taste or smell. She has had COVID-190 in January despite vaccine. The patient has received COVID-19 vaccination(s). They have had a booster dose as well. Denies chest pain, dyspnea, dizziness, lightheadedness, syncope, near syncope. Denies any other upper or lower GI symptoms.  Has been having more constipation recently. Has GERD which does well on PPI. She is taking Metamucil for constipation, which keeps things under control.  Past Medical History:  Diagnosis Date  . Acid reflux   . Diabetes mellitus without complication ([place])     History reviewed. No pertinent surgical history.  Current Outpatient Medications  Medication Sig Dispense Refill  . cefdinir (OMNICEF) 300 MG capsule Take 1 capsule (300 mg total) by mouth 2 (two) times daily. 20 capsule 0  . lansoprazole (PREVACID) 15 MG capsule Take 15 mg by mouth daily at 12 noon.    Marland Kitchen  METAMUCIL FIBER PO Take by mouth daily.    . metroNIDAZOLE (FLAGYL) 500 MG tablet Take 1 tablet (500 mg total) by mouth 2 (two) times daily. 20 tablet 0  . ondansetron (ZOFRAN) 4 MG tablet Take 1 tablet (4 mg total) by mouth 4 (four) times daily as needed for nausea, vomiting or refractory nausea / vomiting. 30 tablet 2  . dicyclomine (BENTYL) 20 MG tablet Take 20 mg by mouth 4 (four)  times daily. (Patient not taking: Reported on 06/17/2020)    . HYDROcodone-acetaminophen (NORCO/VICODIN) 5-325 MG tablet Take 1 tablet by mouth every 6 (six) hours as needed for moderate pain. (Patient not taking: No sig reported) 20 tablet 0   No current facility-administered medications for this visit.    Allergies as of 06/17/2020 - Review Complete 06/17/2020  Allergen Reaction Noted  . Fish allergy Hives 06/15/2020  . Penicillins Itching and Rash 12/22/2014    Family History  Problem Relation Age of Onset  . Cancer Father        lung cancer  . Cancer Brother        lung cancer    Social History   Socioeconomic History  . Marital status: Married    Spouse name: Not on file  . Number of children: Not on file  . Years of education: Not on file  . Highest education level: Not on file  Occupational History  . Not on file  Tobacco Use  . Smoking status: Never Smoker  . Smokeless tobacco: Never Used  Vaping Use  . Vaping Use: Never used  Substance and Sexual Activity  . Alcohol use: No  . Drug use: No  . Sexual activity: Yes    Birth control/protection: None  Other Topics Concern  . Not on file  Social History Narrative  . Not on file   Social Determinants of Health   Financial Resource Strain: Not on file  Food Insecurity: Not on file  Transportation Needs: Not on file  Physical Activity: Not on file  Stress: Not on file  Social Connections: Not on file  Intimate Partner Violence: Not on file    Subjective: Review of Systems  Constitutional: Negative for chills, fever, malaise/fatigue and weight loss.  HENT: Negative for congestion and sore throat.   Respiratory: Negative for cough and shortness of breath.   Cardiovascular: Negative for chest pain and palpitations.  Gastrointestinal: Positive for abdominal pain (Improved), constipation, diarrhea and nausea. Negative for blood in stool, melena and vomiting.  Musculoskeletal: Negative for joint pain and  myalgias.  Skin: Negative for rash.  Neurological: Negative for dizziness and weakness.  Endo/Heme/Allergies: Does not bruise/bleed easily.  Psychiatric/Behavioral: Negative for depression. The patient is not nervous/anxious.   All other systems reviewed and are negative.      Objective: BP 132/83   Pulse 83   Temp (!) 97.5 F (36.4 C)   Ht 5\' 3"  (1.6 m)   Wt 129 lb 12.8 oz (58.9 kg)   LMP 06/07/2015   BMI 22.99 kg/m  Physical Exam Vitals and nursing note reviewed.  Constitutional:      General: She is not in acute distress.    Appearance: Normal appearance. She is well-developed. She is not ill-appearing, toxic-appearing or diaphoretic.  HENT:     Head: Normocephalic and atraumatic.     Nose: No congestion or rhinorrhea.  Eyes:     General: No scleral icterus. Cardiovascular:     Rate and Rhythm: Normal rate and regular rhythm.  Heart sounds: Normal heart sounds.  Pulmonary:     Effort: Pulmonary effort is normal. No respiratory distress.     Breath sounds: Normal breath sounds.  Abdominal:     General: Bowel sounds are normal.     Palpations: Abdomen is soft. There is no hepatomegaly, splenomegaly or mass.     Tenderness: There is abdominal tenderness in the left lower quadrant. There is no guarding or rebound.     Hernia: No hernia is present.    Skin:    General: Skin is warm and dry.     Coloration: Skin is not jaundiced.     Findings: No rash.  Neurological:     General: No focal deficit present.     Mental Status: She is alert and oriented to person, place, and time.  Psychiatric:        Attention and Perception: Attention normal.        Mood and Affect: Mood normal.        Speech: Speech normal.        Behavior: Behavior normal.        Thought Content: Thought content normal.        Cognition and Memory: Cognition and memory normal.      Assessment:  Very pleasant Spanish-speaking female who presents initially for referral for weight loss,  diarrhea, to schedule colonoscopy.  However, it was noted that she was recently in the emergency department for diverticulitis.  She was treated with antibiotics.  No red flag/warning signs or symptoms today.  Diverticulitis with abdominal pain: Acute diverticulitis on CT, treated with Omnicef and Flagyl because she was recently already on Cipro and Flagyl.  She states she is doing better today, minimal pain only with palpation.  Her pain is significantly improved.  She will need a colonoscopy to further evaluate her diverticulitis, she is due based on age anyway.  We will proceed with scheduling.  Nausea: She has been having nausea since starting her antibiotics which she thinks is related to the pills.  She is requesting nausea medication and I will send in Zofran 4 mg 4 times daily as needed for nausea.  Call for any worsening  GERD: Currently well managed on PPI.  Recommend she continue her current medications  Constipation: Currently well managed on Metamucil.  Recommend she continue using Metamucil for regular, soft bowel movements   Proceed with colonoscopy by Dr. Gala Romney in near future: the risks, benefits, and alternatives have been discussed with the patient in detail. The patient states understanding and desires to proceed.  The patient was on a brief course of pain medication for acute diverticulitis but has since finished/stopped. The patient is not on any other anticoagulants, anxiolytics, chronic pain medications, antidepressants, antidiabetics, or iron supplements.  ASA II   Plan: 1. Colonoscopy as described above 2. Zofran 4 mg 4 times a day as needed for nausea 3. Continue PPI 4. Continue Metamucil 5. Follow-up in 3 months.    Thank you for allowing [place] to participate in the care of Betty Ancil Linsey, DNP, AGNP-C Adult & Gerontological Nurse Practitioner [facility]   06/17/2020 10:09 AM   Disclaimer: This note was dictated with  voice recognition software. Similar sounding words can inadvertently be transcribed and may not be corrected upon review.

## 2020-06-29 ENCOUNTER — Other Ambulatory Visit: Payer: Self-pay

## 2020-06-29 DIAGNOSIS — K5732 Diverticulitis of large intestine without perforation or abscess without bleeding: Secondary | ICD-10-CM | POA: Diagnosis not present

## 2020-06-29 DIAGNOSIS — K219 Gastro-esophageal reflux disease without esophagitis: Secondary | ICD-10-CM | POA: Insufficient documentation

## 2020-06-29 DIAGNOSIS — R1032 Left lower quadrant pain: Secondary | ICD-10-CM | POA: Diagnosis not present

## 2020-06-29 DIAGNOSIS — R109 Unspecified abdominal pain: Secondary | ICD-10-CM | POA: Diagnosis not present

## 2020-06-29 DIAGNOSIS — E119 Type 2 diabetes mellitus without complications: Secondary | ICD-10-CM | POA: Diagnosis not present

## 2020-06-30 ENCOUNTER — Emergency Department (HOSPITAL_COMMUNITY)
Admission: EM | Admit: 2020-06-30 | Discharge: 2020-06-30 | Disposition: A | Discharge status: Home / Self Care | Payer: BC Managed Care – PPO | Attending: Emergency Medicine | Admitting: Emergency Medicine

## 2020-06-30 ENCOUNTER — Encounter (HOSPITAL_COMMUNITY): Payer: Self-pay | Admitting: Emergency Medicine

## 2020-06-30 ENCOUNTER — Other Ambulatory Visit: Payer: Self-pay

## 2020-06-30 ENCOUNTER — Emergency Department (HOSPITAL_COMMUNITY): Payer: BC Managed Care – PPO

## 2020-06-30 DIAGNOSIS — R109 Unspecified abdominal pain: Secondary | ICD-10-CM | POA: Diagnosis not present

## 2020-06-30 DIAGNOSIS — K5792 Diverticulitis of intestine, part unspecified, without perforation or abscess without bleeding: Secondary | ICD-10-CM

## 2020-06-30 LAB — CBC WITH DIFFERENTIAL/PLATELET
Abs Immature Granulocytes: 0.06 10*3/uL (ref 0.00–0.07)
Basophils Absolute: 0 10*3/uL (ref 0.0–0.1)
Basophils Relative: 0 %
Eosinophils Absolute: 0 10*3/uL (ref 0.0–0.5)
Eosinophils Relative: 0 %
HCT: 39.8 % (ref 36.0–46.0)
Hemoglobin: 13.2 g/dL (ref 12.0–15.0)
Immature Granulocytes: 0 %
Lymphocytes Relative: 8 %
Lymphs Abs: 1.4 10*3/uL (ref 0.7–4.0)
MCH: 27.4 pg (ref 26.0–34.0)
MCHC: 33.2 g/dL (ref 30.0–36.0)
MCV: 82.6 fL (ref 80.0–100.0)
Monocytes Absolute: 1 10*3/uL (ref 0.1–1.0)
Monocytes Relative: 5 %
Neutro Abs: 15.3 10*3/uL — ABNORMAL HIGH (ref 1.7–7.7)
Neutrophils Relative %: 87 %
Platelets: 307 10*3/uL (ref 150–400)
RBC: 4.82 MIL/uL (ref 3.87–5.11)
RDW: 13.6 % (ref 11.5–15.5)
WBC: 17.8 10*3/uL — ABNORMAL HIGH (ref 4.0–10.5)
nRBC: 0 % (ref 0.0–0.2)

## 2020-06-30 LAB — BASIC METABOLIC PANEL
Anion gap: 10 (ref 5–15)
BUN: 12 mg/dL (ref 6–20)
CO2: 25 mmol/L (ref 22–32)
Calcium: 10.1 mg/dL (ref 8.9–10.3)
Chloride: 100 mmol/L (ref 98–111)
Creatinine, Ser: 0.44 mg/dL (ref 0.44–1.00)
GFR, Estimated: >60 mL/min (ref >60)
Glucose, Bld: 180 mg/dL — ABNORMAL HIGH (ref 70–99)
Potassium: 4.2 mmol/L (ref 3.5–5.1)
Sodium: 135 mmol/L (ref 135–145)

## 2020-06-30 LAB — URINALYSIS, ROUTINE W REFLEX MICROSCOPIC
Bacteria, UA: NONE SEEN
Bilirubin Urine: NEGATIVE
Glucose, UA: NEGATIVE mg/dL
Ketones, ur: NEGATIVE mg/dL
Leukocytes,Ua: NEGATIVE
Nitrite: NEGATIVE
Protein, ur: NEGATIVE mg/dL
Specific Gravity, Urine: 1.011 (ref 1.005–1.030)
pH: 6 (ref 5.0–8.0)

## 2020-06-30 MED ORDER — SODIUM CHLORIDE 0.9 % IV SOLN
1.0000 g | Freq: Once | INTRAVENOUS | Status: AC
  Administered 2020-06-30: 1 g via INTRAVENOUS
  Filled 2020-06-30: qty 10

## 2020-06-30 MED ORDER — IOHEXOL 300 MG/ML  SOLN
100.0000 mL | Freq: Once | INTRAMUSCULAR | Status: AC | PRN
  Administered 2020-06-30: 100 mL via INTRAVENOUS

## 2020-06-30 MED ORDER — CEFDINIR 300 MG PO CAPS: 300.0000 mg | ORAL_CAPSULE | Freq: Two times a day (BID) | ORAL | 0 refills | Status: DC

## 2020-06-30 MED ORDER — METRONIDAZOLE 500 MG PO TABS
500.0000 mg | ORAL_TABLET | Freq: Three times a day (TID) | ORAL | 0 refills | Status: DC
Start: 2020-06-30 — End: 2021-04-12

## 2020-06-30 MED ORDER — METRONIDAZOLE IN NACL 5-0.79 MG/ML-% IV SOLN
500.0000 mg | Freq: Once | INTRAVENOUS | Status: AC
  Administered 2020-06-30: 500 mg via INTRAVENOUS
  Filled 2020-06-30: qty 100

## 2020-06-30 NOTE — ED Notes (Signed)
Patient transported to CT 

## 2020-06-30 NOTE — ED Triage Notes (Signed)
Pt c/o left lower abd pain since 0900 and vomited once tonight.

## 2020-06-30 NOTE — Discharge Instructions (Signed)
La tomografa computarizada parece diverticulitis nuevamente. Voy a Counsellor. Debe volver a ver al gastroenterlogo lo antes posible para programar una colonoscopia.  Si su dolor empeora, regrese al departamento de emergencias.

## 2020-06-30 NOTE — ED Provider Notes (Signed)
[facility] EMERGENCY DEPARTMENT Provider Note   CSN: [id number] Arrival date & time: 06/29/20  2344     History Chief Complaint  Patient presents with  . Abdominal Pain    Betty Ellis is a 55 y.o. female.  Patient presents to the emergency department for evaluation of abdominal pain.  Patient started having left lower abdominal pain around 9 AM today.  She has had pain persistently since.  She did vomit one time tonight.  Patient reports that she had similar pain approximately 3 weeks ago and was seen in this emergency department.        Past Medical History:  Diagnosis Date  . Acid reflux   . Diabetes mellitus without complication [facility])     Patient Active Problem List   Diagnosis Date Noted  . Diverticulitis of colon 06/17/2020  . Diarrhea 06/17/2020  . Constipation 06/17/2020  . Nausea without vomiting 06/17/2020  . Abdominal pain 06/17/2020  . Weight loss, unintentional 06/17/2020    History reviewed. No pertinent surgical history.   OB History   No obstetric history on file.     Family History  Problem Relation Age of Onset  . Cancer Father        lung cancer  . Cancer Brother        lung cancer    Social History   Tobacco Use  . Smoking status: Never Smoker  . Smokeless tobacco: Never Used  Vaping Use  . Vaping Use: Never used  Substance Use Topics  . Alcohol use: No  . Drug use: No    Home Medications Prior to Admission medications   Medication Sig Start Date End Date Taking? Authorizing Provider  cefdinir (OMNICEF) 300 MG capsule Take 1 capsule (300 mg total) by mouth 2 (two) times daily. 06/30/20   Orpah Greek, MD  dicyclomine (BENTYL) 20 MG tablet Take 20 mg by mouth 4 (four) times daily. Patient not taking: Reported on 06/17/2020 06/11/20   [provider]  HYDROcodone-acetaminophen (NORCO/VICODIN) 5-325 MG tablet Take 1 tablet by mouth every 6 (six) hours as needed for moderate pain. Patient not taking: No sig  reported 06/17/15   Milton Ferguson, MD  lansoprazole (PREVACID) 15 MG capsule Take 15 mg by mouth daily at 12 noon.    [provider]  METAMUCIL FIBER PO Take by mouth daily.    [provider]  metroNIDAZOLE (FLAGYL) 500 MG tablet Take 1 tablet (500 mg total) by mouth 3 (three) times daily. 06/30/20   Orpah Greek, MD  ondansetron (ZOFRAN) 4 MG tablet Take 1 tablet (4 mg total) by mouth 4 (four) times daily as needed for nausea, vomiting or refractory nausea / vomiting. 06/17/20   Carlis Stable, NP    Allergies    Fish allergy and Penicillins  Review of Systems   Review of Systems  Gastrointestinal: Positive for abdominal pain and vomiting.  All other systems reviewed and are negative.   Physical Exam Updated Vital Signs BP 128/61   Pulse 92   Temp 98.3 F (36.8 C) (Oral)   Resp 18   Ht 5\' 3"  (1.6 m)   Wt 59 kg   LMP 06/07/2015   SpO2 96%   BMI 23.04 kg/m   Physical Exam Vitals and nursing note reviewed.  Constitutional:      General: She is not in acute distress.    Appearance: Normal appearance. She is well-developed.  HENT:     Head: Normocephalic and atraumatic.  Right Ear: Hearing normal.     Left Ear: Hearing normal.     Nose: Nose normal.  Eyes:     Conjunctiva/sclera: Conjunctivae normal.     Pupils: Pupils are equal, round, and reactive to light.  Cardiovascular:     Rate and Rhythm: Regular rhythm.     Heart sounds: S1 normal and S2 normal. No murmur heard. No friction rub. No gallop.   Pulmonary:     Effort: Pulmonary effort is normal. No respiratory distress.     Breath sounds: Normal breath sounds.  Chest:     Chest wall: No tenderness.  Abdominal:     General: Bowel sounds are normal.     Palpations: Abdomen is soft.     Tenderness: There is abdominal tenderness in the left lower quadrant. There is no guarding or rebound. Negative signs include Murphy's sign and McBurney's sign.     Hernia: No hernia is present.   Musculoskeletal:        General: Normal range of motion.     Cervical back: Normal range of motion and neck supple.  Skin:    General: Skin is warm and dry.     Findings: No rash.  Neurological:     Mental Status: She is alert and oriented to person, place, and time.     GCS: GCS eye subscore is 4. GCS verbal subscore is 5. GCS motor subscore is 6.     Cranial Nerves: No cranial nerve deficit.     Sensory: No sensory deficit.     Coordination: Coordination normal.  Psychiatric:        Speech: Speech normal.        Behavior: Behavior normal.        Thought Content: Thought content normal.     ED Results / Procedures / Treatments   Labs (all labs ordered are listed, but only abnormal results are displayed) Labs Reviewed  CBC WITH DIFFERENTIAL/PLATELET - Abnormal; Notable for the following components:      Result Value   WBC 17.8 (*)    Neutro Abs 15.3 (*)    All other components within normal limits  BASIC METABOLIC PANEL - Abnormal; Notable for the following components:   Glucose, Bld 180 (*)    All other components within normal limits  URINALYSIS, ROUTINE W REFLEX MICROSCOPIC - Abnormal; Notable for the following components:   Color, Urine STRAW (*)    Hgb urine dipstick SMALL (*)    All other components within normal limits    EKG None  Radiology CT ABDOMEN PELVIS W CONTRAST  Result Date: 06/30/2020 CLINICAL DATA:  Lower left-sided abdominal pain. EXAM: CT ABDOMEN AND PELVIS WITH CONTRAST TECHNIQUE: Multidetector CT imaging of the abdomen and pelvis was performed using the standard protocol following bolus administration of intravenous contrast. CONTRAST:  135mL OMNIPAQUE IOHEXOL 300 MG/ML  SOLN COMPARISON:  June 15, 2020 FINDINGS: Lower chest: Very mild atelectasis is seen within the bilateral lung bases. Hepatobiliary: No focal liver abnormality is seen. No gallstones, gallbladder wall thickening, or biliary dilatation. Pancreas: Unremarkable. No pancreatic ductal  dilatation or surrounding inflammatory changes. Spleen: Normal in size without focal abnormality. Adrenals/Urinary Tract: Adrenal glands are unremarkable. Kidneys are normal, without renal calculi, focal lesion, or hydronephrosis. Bladder is unremarkable. Stomach/Bowel: Stomach is within normal limits. Appendix appears normal. No evidence of bowel dilatation. Mildly inflamed diverticula are seen within the proximal sigmoid colon. There is no evidence of associated perforation or abscess. Vascular/Lymphatic: No significant vascular findings are  present. No enlarged abdominal or pelvic lymph nodes. Reproductive: The uterus is normal in size and appearance. A 3.5 cm x 2.3 cm cyst is seen within the right adnexa. Other: No abdominal wall hernia or abnormality. No abdominopelvic ascites. Musculoskeletal: No acute or significant osseous findings. IMPRESSION: 1. Mild sigmoid diverticulitis without evidence of associated perforation or abscess. 2. Right adnexal cyst, likely ovarian in origin. Electronically Signed   By: Virgina Norfolk M.D.   On: 06/30/2020 01:55    Procedures Procedures   Medications Ordered in ED Medications  iohexol (OMNIPAQUE) 300 MG/ML solution 100 mL (100 mLs Intravenous Contrast Given 06/30/20 0113)  cefTRIAXone (ROCEPHIN) 1 g in sodium chloride 0.9 % 100 mL IVPB (0 g Intravenous Stopped 06/30/20 0301)  metroNIDAZOLE (FLAGYL) IVPB 500 mg (0 mg Intravenous Stopped 06/30/20 0406)    ED Course  I have reviewed the triage vital signs and the nursing notes.  Pertinent labs & imaging results that were available during my care of the patient were reviewed by me and considered in my medical decision making (see chart for details).    MDM Rules/Calculators/A&P                          Patient diagnosed with diverticulitis 3 weeks ago.  She was treated with Cipro and Flagyl initially, switched to [facility] and Flagyl.  She finished her antibiotics 4 days ago.  She had been feeling well until  today.  CT scan repeated to rule out complicated diverticulitis.  She does have evidence of mild diverticulitis without perforation or abscess.  As she has been feeling well after treatment with Omnicef and Flagyl until today, suspect that this is a recurrence.  Will certainly require prompt repeat evaluation by gastroenterology as she will need colonoscopy to ensure that this is the correct diagnosis.  Will restart Omnicef and Flagyl until she can follow-up.  Given return precautions.   Final Clinical Impression(s) / ED Diagnoses Final diagnoses:  Diverticulitis    Rx / DC Orders ED Discharge Orders         Ordered    cefdinir (OMNICEF) 300 MG capsule  2 times daily        06/30/20 0213    metroNIDAZOLE (FLAGYL) 500 MG tablet  3 times daily        06/30/20 0213           Orpah Greek, MD 06/30/20 [phone]

## 2020-07-05 ENCOUNTER — Telehealth: Payer: Self-pay | Admitting: *Deleted

## 2020-07-05 NOTE — Telephone Encounter (Signed)
Eric we have orders for patient to schedule TCS with Dr. Gala Romney. Patient has since been to the ED since last OV and diagnosed with diverticulitis. Please advise thanks

## 2020-07-07 NOTE — Telephone Encounter (Signed)
noted 

## 2020-07-07 NOTE — Telephone Encounter (Signed)
PATIENT SCHEDULED IN June FOR FOLLOW UP, THAT WILL BE THE SOONEST APPOINTMENT

## 2020-07-07 NOTE — Telephone Encounter (Signed)
Will need to hold at least 6-8 weeks. Prob needs an OV to evaluate for any further symptoms prior to proceeding. Can we hold a slot for her for next available (after 4-6 weeks)/after her follow-up appointment?

## 2020-07-07 NOTE — Telephone Encounter (Signed)
Fowarding to [place] to schedule OV. We don't have any slots currently to hold for scheduling.

## 2020-07-20 ENCOUNTER — Encounter: Payer: Self-pay | Admitting: Internal Medicine

## 2020-09-16 ENCOUNTER — Ambulatory Visit: Payer: BC Managed Care – PPO | Admitting: Gastroenterology

## 2020-09-16 ENCOUNTER — Ambulatory Visit: Payer: BC Managed Care – PPO | Admitting: Nurse Practitioner

## 2020-10-23 NOTE — Progress Notes (Deleted)
Referring Provider: Jake Samples, PA* Primary Care Physician:  Jake Samples, PA-C Primary GI Physician: Dr. Gala Romney  No chief complaint on file.   HPI:   Betty Ellis is a 55 y.o. female presenting today with a history of diverticulitis, diarrhea, and weight loss presenting today for follow-up.   She has had several antibiotic exposures since the beginning of the year. Treated with cipro and again with cipro and flagyl by PCP for abdominal pain and diarrhea, diagnosed with mild distal descending colon diverticulitis 06/15/2020 (CT documented) and treated with cefdinir and flagyl per ED, mild uncomplicated sigmoid diverticulitis in 06/30/2020 treated again with cefdinir and flagyl.   At her last visit with [place] in March, she was recovering from diverticulitis and was still on antibiotics. Noted her diarrhea had improved.  Reported 32 pound weight loss since December, lack of appetite/early satiety.  Ongoing nausea thought to be secondary to antibiotics.  Abdominal pain improving.  Plan to arrange colonoscopy and follow-up in 3 months.  Ultimately, colonoscopy was not scheduled as she was diagnosed with diverticulitis again in April.  Today:   Weight: 129 lbs March 2022   Past Medical History:  Diagnosis Date   Acid reflux    Diabetes mellitus without complication ([place])     No past surgical history on file.  Current Outpatient Medications  Medication Sig Dispense Refill   cefdinir (OMNICEF) 300 MG capsule Take 1 capsule (300 mg total) by mouth 2 (two) times daily. 20 capsule 0   dicyclomine (BENTYL) 20 MG tablet Take 20 mg by mouth 4 (four) times daily. (Patient not taking: Reported on 06/17/2020)     HYDROcodone-acetaminophen (NORCO/VICODIN) 5-325 MG tablet Take 1 tablet by mouth every 6 (six) hours as needed for moderate pain. (Patient not taking: No sig reported) 20 tablet 0   lansoprazole (PREVACID) 15 MG capsule Take 15 mg by mouth daily at 12 noon.      METAMUCIL FIBER PO Take by mouth daily.     metroNIDAZOLE (FLAGYL) 500 MG tablet Take 1 tablet (500 mg total) by mouth 3 (three) times daily. 30 tablet 0   ondansetron (ZOFRAN) 4 MG tablet Take 1 tablet (4 mg total) by mouth 4 (four) times daily as needed for nausea, vomiting or refractory nausea / vomiting. 30 tablet 2   No current facility-administered medications for this visit.    Allergies as of 10/25/2020 - Review Complete 06/17/2020  Allergen Reaction Noted   Fish allergy Hives 06/15/2020   Penicillins Itching and Rash 12/22/2014    Family History  Problem Relation Age of Onset   Cancer Father        lung cancer   Cancer Brother        lung cancer    Social History   Socioeconomic History   Marital status: Married    Spouse name: Not on file   Number of children: Not on file   Years of education: Not on file   Highest education level: Not on file  Occupational History   Not on file  Tobacco Use   Smoking status: Never   Smokeless tobacco: Never  Vaping Use   Vaping Use: Never used  Substance and Sexual Activity   Alcohol use: No   Drug use: No   Sexual activity: Yes    Birth control/protection: None  Other Topics Concern   Not on file  Social History Narrative   Not on file   Social Determinants of Health   Financial  Resource Strain: Not on file  Food Insecurity: Not on file  Transportation Needs: Not on file  Physical Activity: Not on file  Stress: Not on file  Social Connections: Not on file    Review of Systems: Gen: Denies fever, chills, anorexia. Denies fatigue, weakness, weight loss.  CV: Denies chest pain, palpitations, syncope, peripheral edema, and claudication. Resp: Denies dyspnea at rest, cough, wheezing, coughing up blood, and pleurisy. GI: Denies vomiting blood, jaundice, and fecal incontinence.   Denies dysphagia or odynophagia. Derm: Denies rash, itching, dry skin Psych: Denies depression, anxiety, memory loss, confusion. No  homicidal or suicidal ideation.  Heme: Denies bruising, bleeding, and enlarged lymph nodes.  Physical Exam: LMP 06/07/2015  General:   Alert and oriented. No distress noted. Pleasant and cooperative.  Head:  Normocephalic and atraumatic. Eyes:  Conjuctiva clear without scleral icterus. Mouth:  Oral mucosa pink and moist. Good dentition. No lesions. Heart:  S1, S2 present without murmurs appreciated. Lungs:  Clear to auscultation bilaterally. No wheezes, rales, or rhonchi. No distress.  Abdomen:  +BS, soft, non-tender and non-distended. No rebound or guarding. No HSM or masses noted. Msk:  Symmetrical without gross deformities. Normal posture. Extremities:  Without edema. Neurologic:  Alert and  oriented x4 Psych:  Alert and cooperative. Normal mood and affect.

## 2020-10-25 ENCOUNTER — Ambulatory Visit: Payer: BC Managed Care – PPO | Admitting: Gastroenterology

## 2020-12-23 DIAGNOSIS — E663 Overweight: Secondary | ICD-10-CM | POA: Diagnosis not present

## 2020-12-23 DIAGNOSIS — E119 Type 2 diabetes mellitus without complications: Secondary | ICD-10-CM | POA: Diagnosis not present

## 2020-12-23 DIAGNOSIS — Z6820 Body mass index (BMI) 20.0-20.9, adult: Secondary | ICD-10-CM | POA: Diagnosis not present

## 2020-12-23 DIAGNOSIS — Z1389 Encounter for screening for other disorder: Secondary | ICD-10-CM | POA: Diagnosis not present

## 2020-12-23 DIAGNOSIS — E782 Mixed hyperlipidemia: Secondary | ICD-10-CM | POA: Diagnosis not present

## 2021-01-28 DIAGNOSIS — Z6820 Body mass index (BMI) 20.0-20.9, adult: Secondary | ICD-10-CM | POA: Diagnosis not present

## 2021-01-28 DIAGNOSIS — E663 Overweight: Secondary | ICD-10-CM | POA: Diagnosis not present

## 2021-01-28 DIAGNOSIS — E782 Mixed hyperlipidemia: Secondary | ICD-10-CM | POA: Diagnosis not present

## 2021-01-28 DIAGNOSIS — E119 Type 2 diabetes mellitus without complications: Secondary | ICD-10-CM | POA: Diagnosis not present

## 2021-02-24 DIAGNOSIS — R5383 Other fatigue: Secondary | ICD-10-CM | POA: Diagnosis not present

## 2021-02-24 LAB — TSH: TSH: 0.01 — AB (ref 0.41–5.90)

## 2021-02-24 LAB — VITAMIN D 25 HYDROXY (VIT D DEFICIENCY, FRACTURES): Vit D, 25-Hydroxy: 17.3

## 2021-04-12 ENCOUNTER — Encounter: Payer: Self-pay | Admitting: "Endocrinology

## 2021-04-12 ENCOUNTER — Other Ambulatory Visit: Payer: Self-pay

## 2021-04-12 ENCOUNTER — Ambulatory Visit (INDEPENDENT_AMBULATORY_CARE_PROVIDER_SITE_OTHER): Payer: BC Managed Care – PPO | Admitting: "Endocrinology

## 2021-04-12 VITALS — BP 128/68 | HR 76 | Ht 63.0 in | Wt 119.8 lb

## 2021-04-12 DIAGNOSIS — E0590 Thyrotoxicosis, unspecified without thyrotoxic crisis or storm: Secondary | ICD-10-CM | POA: Diagnosis not present

## 2021-04-12 DIAGNOSIS — E559 Vitamin D deficiency, unspecified: Secondary | ICD-10-CM | POA: Diagnosis not present

## 2021-04-12 MED ORDER — VITAMIN D3 125 MCG (5000 UT) PO CAPS: 5000.0000 [IU] | ORAL_CAPSULE | Freq: Every day | ORAL | 1 refills | Status: DC

## 2021-04-12 NOTE — Progress Notes (Signed)
04/12/2021     Endocrinology Consult Note    Subjective:    Patient ID: Betty Ellis, female    DOB: [birth year], PCP Jake Samples, PA-C.   Past Medical History:  Diagnosis Date   Acid reflux    Diabetes mellitus without complication ([place])    Diverticulitis    Hyperthyroidism     History reviewed. No pertinent surgical history.  Social History   Socioeconomic History   Marital status: Married    Spouse name: Not on file   Number of children: Not on file   Years of education: Not on file   Highest education level: Not on file  Occupational History   Not on file  Tobacco Use   Smoking status: Never   Smokeless tobacco: Never  Vaping Use   Vaping Use: Never used  Substance and Sexual Activity   Alcohol use: No   Drug use: No   Sexual activity: Yes    Birth control/protection: None  Other Topics Concern   Not on file  Social History Narrative   Not on file   Social Determinants of Health   Financial Resource Strain: Not on file  Food Insecurity: Not on file  Transportation Needs: Not on file  Physical Activity: Not on file  Stress: Not on file  Social Connections: Not on file    Family History  Problem Relation Age of Onset   Diabetes Mother    Cancer Father        lung cancer   Cancer Brother        lung cancer    Outpatient Encounter Medications as of 04/12/2021  Medication Sig   Cholecalciferol (VITAMIN D3) 125 MCG (5000 UT) CAPS Take 1 capsule (5,000 Units total) by mouth daily.   Multiple Vitamins-Minerals (LIVER DETOX PO) Take 1 tablet by mouth 2 (two) times daily.   propranolol (INDERAL) 10 MG tablet Take 10 mg by mouth 2 (two) times daily.   [DISCONTINUED] cefdinir (OMNICEF) 300 MG capsule Take 1 capsule (300 mg total) by mouth 2 (two) times daily.   [DISCONTINUED] dicyclomine (BENTYL) 20 MG tablet Take 20 mg by mouth 4 (four) times daily. (Patient not taking: Reported on 06/17/2020)   [DISCONTINUED]  HYDROcodone-acetaminophen (NORCO/VICODIN) 5-325 MG tablet Take 1 tablet by mouth every 6 (six) hours as needed for moderate pain. (Patient not taking: No sig reported)   [DISCONTINUED] lansoprazole (PREVACID) 15 MG capsule Take 15 mg by mouth daily at 12 noon.   [DISCONTINUED] METAMUCIL FIBER PO Take by mouth daily.   [DISCONTINUED] metroNIDAZOLE (FLAGYL) 500 MG tablet Take 1 tablet (500 mg total) by mouth 3 (three) times daily.   [DISCONTINUED] ondansetron (ZOFRAN) 4 MG tablet Take 1 tablet (4 mg total) by mouth 4 (four) times daily as needed for nausea, vomiting or refractory nausea / vomiting.   No facility-administered encounter medications on file as of 04/12/2021.    ALLERGIES: Allergies  Allergen Reactions   Fish Allergy Hives   Penicillins Itching and Rash    Has patient had a PCN reaction causing immediate rash, facial/tongue/throat swelling, SOB or lightheadedness with hypotension: Yes Has patient had a PCN reaction causing severe rash involving mucus membranes or skin necrosis: No Has patient had a PCN reaction that required hospitalization No Has patient had a PCN reaction occurring within the last 10 years: No If all of the above answers are "NO", then may proceed with Cephalosporin use.     VACCINATION STATUS:  There is no immunization  history on file for this patient.   HPI  Betty Ellis is 56 y.o. female who presents today with a medical history as above. she is being seen in consultation for hyperthyroidism requested by Jake Samples, PA-C.  Patient speaks only [place].  She is accompanied by her interpreter Delfino Lovett.   she has been dealing with symptoms of weight loss of 50 pounds over a year, palpitations, tremors, heat intolerance, and anxiety. These symptoms are progressively worsening and troubling to her. her most recent thyroid labs revealed suppressed TSH and elevated thyroid hormone levels on February 24, 2021.  She was started on propanolol 10  mg p.o. twice daily which has helped with the palpitations. she denies dysphagia, choking, shortness of breath, no recent voice change.    she not very sure about family history of thyroid dysfunction.  She denies any personal or family history of thyroid malignancy.  she is not on any anti-thyroid medications nor on any thyroid hormone supplements. she  is willing to proceed with appropriate work up and therapy for thyrotoxicosis.                           Review of systems  Constitutional: + weight loss, + fatigue, + subjective hyperthermia Eyes: no blurry vision, + xerophthalmia ENT: no sore throat, no nodules palpated in throat, no dysphagia/odynophagia, nor hoarseness Cardiovascular: no Chest Pain, no Shortness of Breath, ++  palpitations, no leg swelling Respiratory: no cough, no SOB Gastrointestinal: no Nausea, no Vomiting, no Diarhhea Musculoskeletal: no muscle/joint aches Skin: no rashes Neurological: ++  tremors, no numbness, no tingling, no dizziness Psychiatric: no depression, ++  anxiety   Objective:    BP 128/68    Pulse 76    Ht 5\' 3"  (1.6 m)    Wt 119 lb 12.8 oz (54.3 kg)    LMP 06/07/2015    BMI 21.22 kg/m   Wt Readings from Last 3 Encounters:  04/12/21 119 lb 12.8 oz (54.3 kg)  06/30/20 130 lb 1.1 oz (59 kg)  06/17/20 129 lb 12.8 oz (58.9 kg)                                                Physical exam  Constitutional: Body mass index is 21.22 kg/m., not in acute distress, + anxious state of mind Eyes: PERRLA, EOMI, ++ exophthalmos ENT: moist mucous membranes, +  thyromegaly, no cervical lymphadenopathy Cardiovascular: + Pronounced precordial activity, -tachycardic -patient on propanolol,  no Murmur/Rubs/Gallops Respiratory:  adequate breathing efforts, no gross chest deformity, Clear to auscultation bilaterally Gastrointestinal: abdomen soft, Non -tender, No distension, Bowel Sounds present Musculoskeletal: no gross deformities, strength intact in all four  extremities Skin: moist, warm, no rashes Neurological: ++  tremor with outstretched hands,  ++ Deep Tendon Reflexes  on both lower extremities.   CMP     Component Value Date/Time   NA 135 06/30/2020 0021   K 4.2 06/30/2020 0021   CL 100 06/30/2020 0021   CO2 25 06/30/2020 0021   GLUCOSE 180 (H) 06/30/2020 0021   BUN 12 06/30/2020 0021   CREATININE 0.44 06/30/2020 0021   CALCIUM 10.1 06/30/2020 0021   PROT 7.5 06/15/2020 0750   ALBUMIN 3.9 06/15/2020 0750   AST 13 (L) 06/15/2020 0750   ALT 19 06/15/2020 0750   ALKPHOS  85 06/15/2020 0750   BILITOT 0.5 06/15/2020 0750   GFRNONAA >60 06/30/2020 0021   GFRAA >60 06/17/2015 2012     CBC    Component Value Date/Time   WBC 17.8 (H) 06/30/2020 0021   RBC 4.82 06/30/2020 0021   HGB 13.2 06/30/2020 0021   HCT 39.8 06/30/2020 0021   PLT 307 06/30/2020 0021   MCV 82.6 06/30/2020 0021   MCH 27.4 06/30/2020 0021   MCHC 33.2 06/30/2020 0021   RDW 13.6 06/30/2020 0021   LYMPHSABS 1.4 06/30/2020 0021   MONOABS 1.0 06/30/2020 0021   EOSABS 0.0 06/30/2020 0021   BASOSABS 0.0 06/30/2020 0021    December April 2022 labs: TSH <0.005, total T4 16.6, free thyroxine index 8.1 elevated 25-hydroxy vitamin D 17.3. Recent Results (from the past 2160 hour(s))  VITAMIN D 25 Hydroxy (Vit-D Deficiency, Fractures)     Status: None   Collection Time: 02/24/21 12:00 AM  Result Value Ref Range   Vit D, 25-Hydroxy 17.3   TSH     Status: Abnormal   Collection Time: 02/24/21 12:00 AM  Result Value Ref Range   TSH 0.01 (A) 0.41 - 5.90    Comment: TSH <0.005, TT4 16.6, FTI 8.1, T3 Uptake 49     Assessment & Plan:   1. Hyperthyroidism  she is being seen at a kind request of Jake Samples, [place]. her history and most recent labs are reviewed, and she was examined clinically. 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  Through her interpreter, the potential risks of untreated  thyrotoxicosis and the need for definitive therapy have been discussed in detail with her, and she agrees to proceed with diagnostic workup and treatment plan.   I like to obtain  confirmatory thyroid uptake and scan to be scheduled to be done as soon as possible.   Options of therapy are discussed with her.  We discussed the option of treating it with medications including methimazole or PTU which may have side effects including rash, transaminitis, and bone marrow suppression.  We  also discussed the option of definitive therapy with RAI ablation of the thyroid.   If  she is found to have primary hyperthyroidism from Graves' disease , toxic multinodular goiter or toxic nodular goiter the preferred modality of treatment would be I-131 thyroid ablation.    -Patient is made aware of the high likelihood of post ablative hypothyroidism with subsequent need for lifelong thyroid hormone replacement. she  understands this outcome  and she is  willing to proceed.  Although surgery is one other choice of treatment in some cases, in her case surgery is not a good fit for presentation with only mild goiter.    She is advised to continue propranolol 10 mg p.o. twice daily.  For her vitamin D deficiency: I discussed and prescribed vitamin D 3 5000 units daily x90 days.   -Patient is advised to maintain close follow up with Jake Samples, PA-C for primary care needs.   - Time spent with the patient: 62 minutes, of which >50% was spent in obtaining information about her symptoms, reviewing her previous labs, evaluations, and treatments, counseling her about her hyperthyroidism, vitamin D deficiency, and developing a plan to confirm the diagnosis and long term treatment as necessary. Please refer to " Patient Self Inventory" in the Media  tab for reviewed elements of pertinent patient history.  Mitzie Na participated in the discussions, expressed understanding, and voiced agreement with the  above  plans.  All questions were answered to her satisfaction. she is encouraged to contact clinic should she have any questions or concerns prior to her return visit.   Follow up plan: Return in about 1 week (around 04/19/2021) for F/U with Thyroid Uptake and Scan.   Thank you for involving me in the care of this pleasant patient, and I will continue to update you with her progress.  Glade Lloyd, MD [facility] [place] Phone: [phone]  Fax: [phone]   04/12/2021, 10:28 AM  This note was partially dictated with voice recognition software. Similar sounding words can be transcribed inadequately or may not  be corrected upon review.

## 2021-04-14 ENCOUNTER — Telehealth: Payer: Self-pay | Admitting: "Endocrinology

## 2021-04-14 NOTE — Telephone Encounter (Signed)
No PA required per [facility] [facility]. Also [employer] gave me a confirmation # [id number] that CPT code [phone] does not need PA.

## 2021-04-14 NOTE — Telephone Encounter (Signed)
Gearlean Alf with the pre [facility] called and states this patient needs an authorization for her uptake & there is not one on file. Please call Gearlean Alf at [phone] ext [phone]

## 2021-04-18 ENCOUNTER — Encounter (HOSPITAL_COMMUNITY): Payer: Self-pay

## 2021-04-18 ENCOUNTER — Encounter (HOSPITAL_COMMUNITY)
Admission: RE | Admit: 2021-04-18 | Discharge: 2021-04-18 | Disposition: A | Discharge status: Home / Self Care | Payer: BC Managed Care – PPO | Source: Ambulatory Visit | Attending: "Endocrinology | Admitting: "Endocrinology

## 2021-04-18 ENCOUNTER — Other Ambulatory Visit: Payer: Self-pay

## 2021-04-18 DIAGNOSIS — E0590 Thyrotoxicosis, unspecified without thyrotoxic crisis or storm: Secondary | ICD-10-CM | POA: Diagnosis not present

## 2021-04-18 HISTORY — DX: Malignant (primary) neoplasm, unspecified: C80.1

## 2021-04-18 MED ORDER — SODIUM IODIDE I-123 7.4 MBQ CAPS
500.0000 | ORAL_CAPSULE | Freq: Once | ORAL | Status: AC
  Administered 2021-04-18: 458 via ORAL

## 2021-04-19 ENCOUNTER — Encounter (HOSPITAL_COMMUNITY)
Admission: RE | Admit: 2021-04-19 | Discharge: 2021-04-19 | Disposition: A | Discharge status: Home / Self Care | Payer: BC Managed Care – PPO | Source: Ambulatory Visit | Attending: "Endocrinology | Admitting: "Endocrinology

## 2021-04-19 DIAGNOSIS — E041 Nontoxic single thyroid nodule: Secondary | ICD-10-CM | POA: Diagnosis not present

## 2021-04-19 DIAGNOSIS — E0590 Thyrotoxicosis, unspecified without thyrotoxic crisis or storm: Secondary | ICD-10-CM | POA: Diagnosis not present

## 2021-04-20 ENCOUNTER — Ambulatory Visit (INDEPENDENT_AMBULATORY_CARE_PROVIDER_SITE_OTHER): Payer: BC Managed Care – PPO | Admitting: "Endocrinology

## 2021-04-20 ENCOUNTER — Other Ambulatory Visit: Payer: Self-pay

## 2021-04-20 ENCOUNTER — Encounter: Payer: Self-pay | Admitting: "Endocrinology

## 2021-04-20 VITALS — BP 106/62 | HR 76 | Ht 63.0 in | Wt 117.2 lb

## 2021-04-20 DIAGNOSIS — E0590 Thyrotoxicosis, unspecified without thyrotoxic crisis or storm: Secondary | ICD-10-CM | POA: Diagnosis not present

## 2021-04-20 DIAGNOSIS — E559 Vitamin D deficiency, unspecified: Secondary | ICD-10-CM | POA: Diagnosis not present

## 2021-04-20 DIAGNOSIS — E041 Nontoxic single thyroid nodule: Secondary | ICD-10-CM | POA: Insufficient documentation

## 2021-04-20 NOTE — Progress Notes (Signed)
04/20/2021      Endocrinology follow-up note Betty Ellis is accompanied by her Spanish language interpreter Betty Ellis to clinic today.   Subjective:    Patient ID: Betty Ellis, female    DOB: [birth year], PCP Betty Samples, PA-C.   Past Medical History:  Diagnosis Date   Acid reflux    Cancer ([place])    stomach, kidney   Diabetes mellitus without complication ([place])    Diverticulitis    Hyperthyroidism     History reviewed. No pertinent surgical history.  Social History   Socioeconomic History   Marital status: Married    Spouse name: Not on file   Number of children: Not on file   Years of education: Not on file   Highest education level: Not on file  Occupational History   Not on file  Tobacco Use   Smoking status: Never   Smokeless tobacco: Never  Vaping Use   Vaping Use: Never used  Substance and Sexual Activity   Alcohol use: No   Drug use: No   Sexual activity: Yes    Birth control/protection: None  Other Topics Concern   Not on file  Social History Narrative   Not on file   Social Determinants of Health   Financial Resource Strain: Not on file  Food Insecurity: Not on file  Transportation Needs: Not on file  Physical Activity: Not on file  Stress: Not on file  Social Connections: Not on file    Family History  Problem Relation Age of Onset   Diabetes Mother    Cancer Father        lung cancer   Cancer Brother        lung cancer    Outpatient Encounter Medications as of 04/20/2021  Medication Sig   Cyanocobalamin (VITAMIN B-12 IJ) Inject as directed every 14 (fourteen) days.   Cholecalciferol (VITAMIN D3) 125 MCG (5000 UT) CAPS Take 1 capsule (5,000 Units total) by mouth daily. (Patient not taking: Reported on 04/20/2021)   Multiple Vitamins-Minerals (LIVER DETOX PO) Take 1 tablet by mouth 2 (two) times daily.   propranolol (INDERAL) 10 MG tablet Take 10 mg by mouth 2 (two) times daily.   No facility-administered  encounter medications on file as of 04/20/2021.    ALLERGIES: Allergies  Allergen Reactions   Fish Allergy Hives   Penicillins Itching and Rash    Has patient had a PCN reaction causing immediate rash, facial/tongue/throat swelling, SOB or lightheadedness with hypotension: Yes Has patient had a PCN reaction causing severe rash involving mucus membranes or skin necrosis: No Has patient had a PCN reaction that required hospitalization No Has patient had a PCN reaction occurring within the last 10 years: No If all of the above answers are "NO", then may proceed with Cephalosporin use.     VACCINATION STATUS:  There is no immunization history on file for this patient.   HPI  Betty Ellis is 56 y.o. female who presents today with a medical history as above. she is being seen in follow-up after she was seen in consultation for hyperthyroidism requested by Betty Samples, PA-C.  Patient speaks only [place].  She is accompanied by her interpreter Betty Ellis.      she has been dealing with symptoms of weight loss of 50 pounds over a year, palpitations, tremors, heat intolerance, and anxiety. These symptoms are progressively worsening and troubling to her. her most recent thyroid labs revealed suppressed TSH and elevated thyroid hormone  levels on February 24, 2021.  She was started on propanolol 10 mg p.o. twice daily which has helped with the palpitations. she denies dysphagia, choking, shortness of breath, no recent voice change.  She was sent for thyroid uptake and scan during her last visit.  Her uptake and scan shows 63% uptake, however cold nodule in the left lobe of her thyroid.   she is not very sure about family history of thyroid dysfunction.  She denies any personal or family history of thyroid malignancy.  she is not on any anti-thyroid medications nor on any thyroid hormone supplements. she  is willing to proceed with appropriate work up and therapy for  thyrotoxicosis.                           Review of systems  Constitutional: + weight loss, + fatigue, + subjective hyperthermia Eyes: no blurry vision, + xerophthalmia ENT: no sore throat, no nodules palpated in throat, no dysphagia/odynophagia, nor hoarseness Cardiovascular: no Chest Pain, no Shortness of Breath, ++  palpitations, no leg swelling Respiratory: no cough, no SOB Gastrointestinal: no Nausea, no Vomiting, no Diarhhea Musculoskeletal: no muscle/joint aches Skin: no rashes Neurological: ++  tremors, no numbness, no tingling, no dizziness Psychiatric: no depression, ++  anxiety   Objective:    BP 106/62    Pulse 76    Ht 5\' 3"  (1.6 m)    Wt 117 lb 3.2 oz (53.2 kg)    LMP 06/07/2015    BMI 20.76 kg/m   Wt Readings from Last 3 Encounters:  04/20/21 117 lb 3.2 oz (53.2 kg)  04/12/21 119 lb 12.8 oz (54.3 kg)  06/30/20 130 lb 1.1 oz (59 kg)                                                Physical exam  Constitutional: Body mass index is 20.76 kg/m., not in acute distress, + anxious state of mind Eyes: PERRLA, EOMI, ++ exophthalmos ENT: moist mucous membranes, +  thyromegaly, no cervical lymphadenopathy Cardiovascular: + Pronounced precordial activity, -tachycardic -patient on propanolol,  no Murmur/Rubs/Gallops Respiratory:  adequate breathing efforts, no gross chest deformity, Clear to auscultation bilaterally Gastrointestinal: abdomen soft, Non -tender, No distension, Bowel Sounds present Musculoskeletal: no gross deformities, strength intact in all four extremities Skin: moist, warm, no rashes Neurological: ++  tremor with outstretched hands,  ++ Deep Tendon Reflexes  on both lower extremities.   CMP     Component Value Date/Time   Ellis 135 06/30/2020 0021   K 4.2 06/30/2020 0021   CL 100 06/30/2020 0021   CO2 25 06/30/2020 0021   GLUCOSE 180 (H) 06/30/2020 0021   BUN 12 06/30/2020 0021   CREATININE 0.44 06/30/2020 0021   CALCIUM 10.1 06/30/2020 0021   PROT  7.5 06/15/2020 0750   ALBUMIN 3.9 06/15/2020 0750   AST 13 (L) 06/15/2020 0750   ALT 19 06/15/2020 0750   ALKPHOS 85 06/15/2020 0750   BILITOT 0.5 06/15/2020 0750   GFRNONAA >60 06/30/2020 0021   GFRAA >60 06/17/2015 2012     CBC    Component Value Date/Time   WBC 17.8 (H) 06/30/2020 0021   RBC 4.82 06/30/2020 0021   HGB 13.2 06/30/2020 0021   HCT 39.8 06/30/2020 0021   PLT 307 06/30/2020 0021   MCV  82.6 06/30/2020 0021   MCH 27.4 06/30/2020 0021   MCHC 33.2 06/30/2020 0021   RDW 13.6 06/30/2020 0021   LYMPHSABS 1.4 06/30/2020 0021   MONOABS 1.0 06/30/2020 0021   EOSABS 0.0 06/30/2020 0021   BASOSABS 0.0 06/30/2020 0021    December April 2022 labs: TSH <0.005, total T4 16.6, free thyroxine index 8.1 elevated 25-hydroxy vitamin D 17.3. Recent Results (from the past 2160 hour(s))  VITAMIN D 25 Hydroxy (Vit-D Deficiency, Fractures)     Status: None   Collection Time: 02/24/21 12:00 AM  Result Value Ref Range   Vit D, 25-Hydroxy 17.3   TSH     Status: Abnormal   Collection Time: 02/24/21 12:00 AM  Result Value Ref Range   TSH 0.01 (A) 0.41 - 5.90    Comment: TSH <0.005, TT4 16.6, FTI 8.1, T3 Uptake 49    Thyroid uptake and scan on April 19, 2021. FINDINGS: A pyramidal lobe is identified. A cold nodule is seen in the lower pole of the left thyroid lobe.   4 hour I-123 uptake = 58.5% (normal 5-20%)   24 hour I-123 uptake = 63.7% (normal 10-30%)   IMPRESSION: 1. Abnormal increased uptake in the thyroid on 4 hour and 24 imaging consistent with reported history of hyperthyroidism. 2. A cold nodule is seen in the lower pole of the left thyroid lobe. Recommend correlation with ultrasound.   Assessment & Plan:   1. Hyperthyroidism 2.  cold thyroid nodule   her history and most recent labs are reviewed, and she was examined clinically. 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  Through her  interpreter, the potential risks of untreated thyrotoxicosis and the need for definitive therapy have been discussed in detail with her, and she agrees to proceed with diagnostic workup and treatment plan. Her uptake and scan confirms hyperthyroidism with 63.7% uptake in 24 hours.  The same scan showed cold nodule which will need further characterization by thyroid ultrasound. -I discussed the need for this ultrasound and fine-needle aspiration if necessary before giving her definitive treatment with I-131.     Options of therapy are discussed with her.  Including I-131 thyroid ablation, antithyroid medications, or surgery.    -Patient is made aware of the high likelihood of post ablative hypothyroidism with subsequent need for lifelong thyroid hormone replacement. she  understands this outcome  and she is  willing to proceed.   -She will return in 1 week with her thyroid ultrasound. She is advised to continue propranolol 10 mg p.o. twice daily.  For her vitamin D deficiency: I discussed and prescribed vitamin D 3 5000 units daily x90 days.   -Patient is advised to maintain close follow up with Betty Samples, PA-C for primary care needs.    I spent 30 minutes in the care of the patient today including review of labs from Thyroid Function, CMP, and other relevant labs ; imaging/biopsy records (current and previous including abstractions from other facilities); face-to-face time discussing  her lab results and symptoms, medications doses, her options of short and long term treatment based on the latest standards of care / guidelines;   and documenting the encounter.  Betty Ellis  participated in the discussions, expressed understanding, and voiced agreement with the above plans.  All questions were answered to her satisfaction. she is encouraged to contact clinic should she have any questions or concerns prior to her return visit.   Follow up plan: Return in about 1  week (around  04/27/2021) for Thyroid / Neck Ultrasound.   Thank you for involving me in the care of this pleasant patient, and I will continue to update you with her progress.  Glade Lloyd, MD [facility] End[facility]in[facility] Phone: [phone]  Fax: [phone]   04/20/2021, 12:44 PM  This note was partially dictated with voice recognition software. Similar sounding words can be transcribed inadequately or may not  be corrected upon review.

## 2021-04-26 DIAGNOSIS — Z6820 Body mass index (BMI) 20.0-20.9, adult: Secondary | ICD-10-CM | POA: Diagnosis not present

## 2021-04-26 DIAGNOSIS — N390 Urinary tract infection, site not specified: Secondary | ICD-10-CM | POA: Diagnosis not present

## 2021-04-27 ENCOUNTER — Ambulatory Visit (HOSPITAL_COMMUNITY)
Admission: RE | Admit: 2021-04-27 | Discharge: 2021-04-27 | Disposition: A | Discharge status: Home / Self Care | Payer: BC Managed Care – PPO | Source: Ambulatory Visit | Attending: "Endocrinology | Admitting: "Endocrinology

## 2021-04-27 ENCOUNTER — Other Ambulatory Visit: Payer: Self-pay

## 2021-04-27 DIAGNOSIS — E041 Nontoxic single thyroid nodule: Secondary | ICD-10-CM | POA: Diagnosis not present

## 2021-04-29 ENCOUNTER — Other Ambulatory Visit: Payer: Self-pay

## 2021-04-29 ENCOUNTER — Ambulatory Visit (INDEPENDENT_AMBULATORY_CARE_PROVIDER_SITE_OTHER): Payer: BC Managed Care – PPO | Admitting: "Endocrinology

## 2021-04-29 ENCOUNTER — Encounter: Payer: Self-pay | Admitting: "Endocrinology

## 2021-04-29 VITALS — BP 110/64 | HR 76 | Ht 63.0 in | Wt 118.2 lb

## 2021-04-29 DIAGNOSIS — E041 Nontoxic single thyroid nodule: Secondary | ICD-10-CM | POA: Diagnosis not present

## 2021-04-29 DIAGNOSIS — E559 Vitamin D deficiency, unspecified: Secondary | ICD-10-CM

## 2021-04-29 DIAGNOSIS — E0520 Thyrotoxicosis with toxic multinodular goiter without thyrotoxic crisis or storm: Secondary | ICD-10-CM | POA: Diagnosis not present

## 2021-04-29 NOTE — Progress Notes (Signed)
04/29/2021      Endocrinology follow-up note Betty Ellis is accompanied by her Spanish language interpreter  Betty Ellis.   Subjective:    Patient ID: Betty Ellis, female    DOB: 1965-12-07, PCP Betty Samples, PA-C.   Past Medical History:  Diagnosis Date   Acid reflux    Cancer ([place])    stomach, kidney   Diabetes mellitus without complication ([place])    Diverticulitis    Hyperthyroidism     History reviewed. No pertinent surgical history.  Social History   Socioeconomic History   Marital status: Married    Spouse name: Not on file   Number of children: Not on file   Years of education: Not on file   Highest education level: Not on file  Occupational History   Not on file  Tobacco Use   Smoking status: Never   Smokeless tobacco: Never  Vaping Use   Vaping Use: Never used  Substance and Sexual Activity   Alcohol use: No   Drug use: No   Sexual activity: Yes    Birth control/protection: None  Other Topics Concern   Not on file  Social History Narrative   Not on file   Social Determinants of Health   Financial Resource Strain: Not on file  Food Insecurity: Not on file  Transportation Needs: Not on file  Physical Activity: Not on file  Stress: Not on file  Social Connections: Not on file    Family History  Problem Relation Age of Onset   Diabetes Mother    Cancer Father        lung cancer   Cancer Brother        lung cancer    Outpatient Encounter Medications as of 04/29/2021  Medication Sig   Cholecalciferol (VITAMIN D3) 125 MCG (5000 UT) CAPS Take 1 capsule (5,000 Units total) by mouth daily.   ciprofloxacin (CIPRO) 500 MG tablet Take 500 mg by mouth 2 (two) times daily.   Cyanocobalamin (VITAMIN B-12 IJ) Inject as directed every 14 (fourteen) days.   Multiple Vitamins-Minerals (LIVER DETOX PO) Take 1 tablet by mouth 2 (two) times daily. (Patient not taking: Reported on 04/29/2021)   propranolol (INDERAL) 10 MG tablet Take 10  mg by mouth 2 (two) times daily.   No facility-administered encounter medications on file as of 04/29/2021.    ALLERGIES: Allergies  Allergen Reactions   Fish Allergy Hives   Penicillins Itching and Rash    Has patient had a PCN reaction causing immediate rash, facial/tongue/throat swelling, SOB or lightheadedness with hypotension: Yes Has patient had a PCN reaction causing severe rash involving mucus membranes or skin necrosis: No Has patient had a PCN reaction that required hospitalization No Has patient had a PCN reaction occurring within the last 10 years: No If all of the above answers are "NO", then may proceed with Cephalosporin use.     VACCINATION STATUS:  There is no immunization history on file for this patient.   HPI  Betty Ellis is 56 y.o. female who presents today with a medical history as above. she is being seen in follow-up after she was seen in consultation for hyperthyroidism requested by Betty Samples, PA-C.  Patient speaks only [place].  She is accompanied by her interpreter Betty Ellis.   she has been dealing with symptoms of weight loss of 50 pounds over a year, palpitations, tremors, heat intolerance, and anxiety. These symptoms are progressively worsening and troubling to her. her  most recent thyroid labs revealed suppressed TSH and elevated thyroid hormone levels on February 24, 2021.  She was started on propanolol 10 mg p.o. twice daily which has helped with the palpitations. she denies dysphagia, choking, shortness of breath, no recent voice change.  She was sent for thyroid uptake and scan during her last visit.  Her uptake and scan shows 63% uptake, however cold nodule in the left lobe of her thyroid. She was sent for thyroid ultrasound for better study of her thyroid anatomy. Her thyroid ultrasound shows 6 nodules ( 4 on right and 2 on left). Most of the nodules are not suspicious but on nodule on left lobe corresponding to the cold nodule on  the uptake and scan is suspicious and radiology is suggesting fine-needle aspiration.   she is not very sure about family history of thyroid dysfunction.  She denies any personal or family history of thyroid malignancy.  she is not on any anti-thyroid medications nor on any thyroid hormone supplements. she  is willing to proceed with appropriate work up and therapy for thyrotoxicosis.                           Review of systems  Constitutional: + weight loss, + fatigue, + subjective hyperthermia Eyes: no blurry vision, + xerophthalmia ENT: no sore throat, no nodules palpated in throat, no dysphagia/odynophagia, nor hoarseness Cardiovascular: no Chest Pain, no Shortness of Breath, ++  palpitations, no leg swelling Respiratory: no cough, no SOB Gastrointestinal: no Nausea, no Vomiting, no Diarhhea Musculoskeletal: no muscle/joint aches Skin: no rashes Neurological: ++  tremors, no numbness, no tingling, no dizziness Psychiatric: no depression, ++  anxiety   Objective:    BP 110/64    Pulse 76    Ht 5\' 3"  (1.6 m)    Wt 118 lb 3.2 oz (53.6 kg)    LMP 06/07/2015    BMI 20.94 kg/m   Wt Readings from Last 3 Encounters:  04/29/21 118 lb 3.2 oz (53.6 kg)  04/20/21 117 lb 3.2 oz (53.2 kg)  04/12/21 119 lb 12.8 oz (54.3 kg)                                                Physical exam  Constitutional: Body mass index is 20.94 kg/m., not in acute distress, + anxious state of mind Eyes: PERRLA, EOMI, ++ exophthalmos ENT: moist mucous membranes, +  thyromegaly, no cervical lymphadenopathy Cardiovascular: + Pronounced precordial activity, -tachycardic -patient on propanolol,  no Murmur/Rubs/Gallops Respiratory:  adequate breathing efforts, no gross chest deformity, Clear to auscultation bilaterally Gastrointestinal: abdomen soft, Non -tender, No distension, Bowel Sounds present Musculoskeletal: no gross deformities, strength intact in all four extremities Skin: moist, warm, no  rashes Neurological: ++  tremor with outstretched hands,  ++ Deep Tendon Reflexes  on both lower extremities.   CMP     Component Value Date/Time   Ellis 135 06/30/2020 0021   K 4.2 06/30/2020 0021   CL 100 06/30/2020 0021   CO2 25 06/30/2020 0021   GLUCOSE 180 (H) 06/30/2020 0021   BUN 12 06/30/2020 0021   CREATININE 0.44 06/30/2020 0021   CALCIUM 10.1 06/30/2020 0021   PROT 7.5 06/15/2020 0750   ALBUMIN 3.9 06/15/2020 0750   AST 13 (L) 06/15/2020 0750   ALT 19 06/15/2020 0750  ALKPHOS 85 06/15/2020 0750   BILITOT 0.5 06/15/2020 0750   GFRNONAA >60 06/30/2020 0021   GFRAA >60 06/17/2015 2012     CBC    Component Value Date/Time   WBC 17.8 (H) 06/30/2020 0021   RBC 4.82 06/30/2020 0021   HGB 13.2 06/30/2020 0021   HCT 39.8 06/30/2020 0021   PLT 307 06/30/2020 0021   MCV 82.6 06/30/2020 0021   MCH 27.4 06/30/2020 0021   MCHC 33.2 06/30/2020 0021   RDW 13.6 06/30/2020 0021   LYMPHSABS 1.4 06/30/2020 0021   MONOABS 1.0 06/30/2020 0021   EOSABS 0.0 06/30/2020 0021   BASOSABS 0.0 06/30/2020 0021    December April 2022 labs: TSH <0.005, total T4 16.6, free thyroxine index 8.1 elevated 25-hydroxy vitamin D 17.3. Recent Results (from the past 2160 hour(s))  VITAMIN D 25 Hydroxy (Vit-D Deficiency, Fractures)     Status: None   Collection Time: 02/24/21 12:00 AM  Result Value Ref Range   Vit D, 25-Hydroxy 17.3   TSH     Status: Abnormal   Collection Time: 02/24/21 12:00 AM  Result Value Ref Range   TSH 0.01 (A) 0.41 - 5.90    Comment: TSH <0.005, TT4 16.6, FTI 8.1, T3 Uptake 49    Thyroid uptake and scan on April 19, 2021. FINDINGS: A pyramidal lobe is identified. A cold nodule is seen in the lower pole of the left thyroid lobe.   4 hour I-123 uptake = 58.5% (normal 5-20%)   24 hour I-123 uptake = 63.7% (normal 10-30%)   IMPRESSION: 1. Abnormal increased uptake in the thyroid on 4 hour and 24 imaging consistent with reported history of hyperthyroidism. 2.  A cold nodule is seen in the lower pole of the left thyroid lobe. Recommend correlation with ultrasound.  Thyroid ultrasound on April 27, 2021 IMPRESSION: 1. Markedly heterogeneous and potentially hyperemic thyroid, nonspecific though could be seen in the setting of an acute thyroiditis. Clinical correlation is advised. 2. Nodule labeled #6, likely correlating with the cold nodule seen on preceding nuclear medicine thyroid uptake and scan, meets imaging criteria to recommend percutaneous sampling as indicated. 3. None of the additional discretely measured thyroid nodules/pseudo nodules meet imaging criteria to recommend percutaneous sampling or continued dedicated follow-up.   Assessment & Plan:   1. Hyperthyroidism 2.  cold thyroid nodule on left lobe confirmed on ultrasound   her history and most recent labs are reviewed, and she was examined clinically. 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  Through her interpreter, the potential risks of untreated thyrotoxicosis and the need for definitive therapy have been discussed in detail with her, and she agrees to proceed with diagnostic workup and treatment plan. Her uptake and scan confirms hyperthyroidism with 63.7% uptake in 24 hours.  The same scan showed cold nodule which will need further characterization by thyroid ultrasound.  She was sent for thyroid ultrasound for better study of her thyroid anatomy. Her thyroid ultrasound shows 6 nodules ( 4 on right and 2 on left). Most of the nodules are not suspicious but on nodule on left lobe corresponding to the cold nodule on the uptake and scan is suspicious and radiology is suggesting fine-needle aspiration.  She was hesitant with this decision, but with Mendel Ryder interpretation and further discussion she reluctantly accepts this procedure. It would be ordered to be performed at [facility] radiology.  If her biopsy  is negative, she will be considered for I-131 thyroid ablation.  If her biopsy is positive, her option would be surgery.   -Patient is made aware of the high likelihood of post ablative hypothyroidism with subsequent need for lifelong thyroid hormone replacement. she  understands this outcome  and she is  willing to proceed.   -She is advised to return in 7 to 10 days with biopsy results. She is advised to continue propranolol 10 mg p.o. twice daily.  For her vitamin D deficiency: I discussed and prescribed vitamin D 3 5000 units daily x90 days.   -Patient is advised to maintain close follow up with Betty Samples, PA-C for primary care needs.    I spent 31 minutes in the care of the patient today including review of labs from Thyroid Function, CMP, and other relevant labs ; imaging/biopsy records (current and previous including abstractions from other facilities); face-to-face time discussing  her lab results and symptoms, medications doses, her options of short and long term treatment based on the latest standards of care / guidelines;   and documenting the encounter.  Betty Ellis and her interpreter  , Betty Ellis, participated in the discussions, expressed understanding, and voiced agreement with the above plans.  All questions were answered to her satisfaction. she is encouraged to contact clinic should she have any questions or concerns prior to her return visit.    Follow up plan: Return in about 1 week (around 05/06/2021) for F/U with Biopsy Results.   Thank you for involving me in the care of this pleasant patient, and I will continue to update you with her progress.  Glade Lloyd, MD [facility] Endocrinology [facility] Phone: [phone]  Fax: [phone]   04/29/2021, 12:36 PM  This note was partially dictated with voice recognition software. Similar sounding words can be transcribed inadequately or may not  be corrected upon review.

## 2021-05-03 ENCOUNTER — Telehealth: Payer: Self-pay | Admitting: "Endocrinology

## 2021-05-03 NOTE — Telephone Encounter (Signed)
Pt called the after hours nurse line to cancel her biopsy. I called pt back to see why she was needing to cancel this because she has a follow up with [place] on 2/20. Patient could not speak English & I advised her to call Forestine Na if she needs to cancel the biopsy.

## 2021-05-04 ENCOUNTER — Ambulatory Visit (HOSPITAL_COMMUNITY): Admission: RE | Admit: 2021-05-04 | Payer: BC Managed Care – PPO | Source: Ambulatory Visit

## 2021-05-04 NOTE — Telephone Encounter (Signed)
Attempted to contact. No answer. Letter sent

## 2021-05-05 ENCOUNTER — Other Ambulatory Visit (HOSPITAL_COMMUNITY): Payer: BC Managed Care – PPO

## 2021-05-05 NOTE — Telephone Encounter (Signed)
Pt called and states that she is not doing her biopsy, her appt on Monday was to follow up with her biopsy results. I canceled that appointment. Pt will call back at a later time.

## 2021-05-09 ENCOUNTER — Ambulatory Visit: Payer: BC Managed Care – PPO | Admitting: "Endocrinology

## 2021-05-10 NOTE — Telephone Encounter (Signed)
Attempted to contact patient, she did not answer. If she calls back I will let you know so you can speak with her.

## 2021-05-10 NOTE — Telephone Encounter (Signed)
Discussed with pt using an interpreter the importance of having a biopsy in order to properly treat her hyperthyroidism. Pt stated she is in [place] and is seeing a specialist there that is treating her hyperthyroidism. Dr.Nida made aware.

## 2021-11-24 DIAGNOSIS — Z1331 Encounter for screening for depression: Secondary | ICD-10-CM | POA: Diagnosis not present

## 2021-11-24 DIAGNOSIS — Z681 Body mass index (BMI) 19 or less, adult: Secondary | ICD-10-CM | POA: Diagnosis not present

## 2021-11-24 DIAGNOSIS — E782 Mixed hyperlipidemia: Secondary | ICD-10-CM | POA: Diagnosis not present

## 2021-11-24 DIAGNOSIS — Z0001 Encounter for general adult medical examination with abnormal findings: Secondary | ICD-10-CM | POA: Diagnosis not present

## 2021-11-24 DIAGNOSIS — R946 Abnormal results of thyroid function studies: Secondary | ICD-10-CM | POA: Diagnosis not present

## 2021-11-24 DIAGNOSIS — E663 Overweight: Secondary | ICD-10-CM | POA: Diagnosis not present

## 2021-11-24 DIAGNOSIS — E119 Type 2 diabetes mellitus without complications: Secondary | ICD-10-CM | POA: Diagnosis not present

## 2021-11-28 ENCOUNTER — Telehealth: Payer: Self-pay | Admitting: "Endocrinology

## 2021-11-28 ENCOUNTER — Other Ambulatory Visit: Payer: Self-pay | Admitting: "Endocrinology

## 2021-11-28 DIAGNOSIS — E0520 Thyrotoxicosis with toxic multinodular goiter without thyrotoxic crisis or storm: Secondary | ICD-10-CM

## 2021-11-28 NOTE — Telephone Encounter (Signed)
Received new referral on pt from [facility]. Pt was to have a biopsy done back in Feb but went to [place]. Can you put in a new order for pt to have biopsy

## 2021-11-29 LAB — T3, FREE: T3, Free: 5 pg/mL — ABNORMAL HIGH (ref 2.0–4.4)

## 2021-11-29 LAB — TSH: TSH: <0.005 u[IU]/mL — ABNORMAL LOW (ref 0.450–4.500)

## 2021-11-29 LAB — T4, FREE: Free T4: 1.83 ng/dL — ABNORMAL HIGH (ref 0.82–1.77)

## 2021-11-29 LAB — THYROID PEROXIDASE ANTIBODY: Thyroperoxidase Ab SerPl-aCnc: 460 IU/mL — ABNORMAL HIGH (ref 0–34)

## 2021-11-29 LAB — THYROGLOBULIN ANTIBODY: Thyroglobulin Antibody: 32.2 IU/mL — ABNORMAL HIGH (ref 0.0–0.9)

## 2021-12-08 ENCOUNTER — Encounter: Payer: Self-pay | Admitting: "Endocrinology

## 2021-12-08 ENCOUNTER — Ambulatory Visit (INDEPENDENT_AMBULATORY_CARE_PROVIDER_SITE_OTHER): Payer: BC Managed Care – PPO | Admitting: "Endocrinology

## 2021-12-08 VITALS — BP 122/68 | HR 84 | Ht 63.0 in | Wt 114.6 lb

## 2021-12-08 DIAGNOSIS — E041 Nontoxic single thyroid nodule: Secondary | ICD-10-CM

## 2021-12-08 NOTE — Progress Notes (Signed)
12/08/2021      Endocrinology follow-up note Betty Ellis is accompanied by her Spanish language interpreter  Betty Ellis.   Subjective:    Patient ID: Betty Ellis, female    DOB: [birth year], PCP Betty Samples, PA-C.   Past Medical History:  Diagnosis Date   Acid reflux    Cancer ([place])    stomach, kidney   Diabetes mellitus without complication ([place])    Diverticulitis    Hyperthyroidism     History reviewed. No pertinent surgical history.  Social History   Socioeconomic History   Marital status: Married    Spouse name: Not on file   Number of children: Not on file   Years of education: Not on file   Highest education level: Not on file  Occupational History   Not on file  Tobacco Use   Smoking status: Never   Smokeless tobacco: Never  Vaping Use   Vaping Use: Never used  Substance and Sexual Activity   Alcohol use: No   Drug use: No   Sexual activity: Yes    Birth control/protection: None  Other Topics Concern   Not on file  Social History Narrative   Not on file   Social Determinants of Health   Financial Resource Strain: Not on file  Food Insecurity: Not on file  Transportation Needs: Not on file  Physical Activity: Not on file  Stress: Not on file  Social Connections: Not on file    Family History  Problem Relation Age of Onset   Diabetes Mother    Cancer Father        lung cancer   Cancer Brother        lung cancer    Outpatient Encounter Medications as of 12/08/2021  Medication Sig   COLLAGEN PO Take 2 tablets by mouth daily.   Ferrous Sulfate (IRON PO) Take 1 tablet by mouth daily.   MAGNESIUM CITRATE PO Take by mouth.   OVER THE COUNTER MEDICATION Gastrisan 2 tablets tid   OVER THE COUNTER MEDICATION Fenugreek 2 capsules before each meal   OVER THE COUNTER MEDICATION Spirulina 2 tablets bid   OVER THE COUNTER MEDICATION Active carbon 2 tablet before each meal   OVER THE COUNTER MEDICATION Multivitamin with iron    TURMERIC PO Take by mouth.   lisinopril (ZESTRIL) 2.5 MG tablet Take 1 tablet by mouth daily.   metFORMIN (GLUCOPHAGE) 500 MG tablet 1 tablet 2 (two) times daily.   propranolol (INDERAL) 10 MG tablet Take 10 mg by mouth 2 (two) times daily.   [DISCONTINUED] Cholecalciferol (VITAMIN D3) 125 MCG (5000 UT) CAPS Take 1 capsule (5,000 Units total) by mouth daily. (Patient not taking: Reported on 12/08/2021)   [DISCONTINUED] ciprofloxacin (CIPRO) 500 MG tablet Take 500 mg by mouth 2 (two) times daily.   [DISCONTINUED] Cyanocobalamin (VITAMIN B-12 IJ) Inject as directed every 14 (fourteen) days. (Patient not taking: Reported on 12/08/2021)   [DISCONTINUED] Multiple Vitamins-Minerals (LIVER DETOX PO) Take 1 tablet by mouth 2 (two) times daily. (Patient not taking: Reported on 04/29/2021)   No facility-administered encounter medications on file as of 12/08/2021.    ALLERGIES: Allergies  Allergen Reactions   Fish Allergy Hives   Penicillins Itching and Rash    Has patient had a PCN reaction causing immediate rash, facial/tongue/throat swelling, SOB or lightheadedness with hypotension: Yes Has patient had a PCN reaction causing severe rash involving mucus membranes or skin necrosis: No Has patient had a PCN reaction that  required hospitalization No Has patient had a PCN reaction occurring within the last 10 years: No If all of the above answers are "NO", then may proceed with Cephalosporin use.     VACCINATION STATUS:  There is no immunization history on file for this patient.   HPI  Betty Ellis is 56 y.o. female who presents today with a medical history as above. she is being seen in follow-up after she was seen in consultation for hyperthyroidism requested by Betty Samples, PA-C.  Patient speaks only [place].  She is accompanied by her interpreter Betty Ellis. She did have a high thyroid uptake and scan confirming primary hyperthyroidism during her last visit.  On this image, she  was found to have a cold nodule in the left lobe of her thyroid.  Biopsy was recommended, however patient left the country before she completed the work-up.  She returns with repeat labs showing persistent hyperthyroidism.  She never had a biopsy done.  This needed to happen before administering  Ablative treatment  to treat her hyperthyroidism.  She is reporting that she had an ultrasound done in [place] and was "normal"   she has been dealing with symptoms of weight loss of 50 pounds over a year, palpitations, tremors, heat intolerance, and anxiety.  She has lost 4 more pounds since last visit. These symptoms are progressively worsening and troubling to her. her most recent thyroid labs revealed suppressed TSH and elevated thyroid hormone levels on February 24, 2021.  She was started on propanolol 10 mg p.o. twice daily which has helped with the palpitations.   she denies dysphagia, choking, shortness of breath, no recent voice change.    Her uptake and scan shows 63% uptake, however cold nodule in the left lobe of her thyroid. She remains on propranolol 10 mg p.o. twice daily. -Her last thyroid ultrasound for better study of her thyroid anatomy. Her thyroid ultrasound shows 6 nodules ( 4 on right and 2 on left). Most of the nodules are not suspicious but on nodule on left lobe corresponding to the cold nodule on the uptake and scan is suspicious and radiology is suggesting fine-needle aspiration.   she is not very sure about family history of thyroid dysfunction.  She denies any personal or family history of thyroid malignancy.  she is not on any anti-thyroid medications nor on any thyroid hormone supplements. she  is willing to proceed with appropriate work up and therapy for thyrotoxicosis.                           Review of systems  Constitutional: + weight loss, + fatigue, + subjective hyperthermia Eyes: no blurry vision, + xerophthalmia ENT: no sore throat, no nodules palpated in throat, no  dysphagia/odynophagia, nor hoarseness Cardiovascular: no Chest Pain, no Shortness of Breath, ++  palpitations, no leg swelling Respiratory: no cough, no SOB Gastrointestinal: no Nausea, no Vomiting, no Diarhhea Musculoskeletal: no muscle/joint aches Skin: no rashes Neurological: ++  tremors, no numbness, no tingling, no dizziness Psychiatric: no depression, ++  anxiety   Objective:    BP 122/68   Pulse 84   Ht '5\' 3"'$  (1.6 m)   Wt 114 lb 9.6 oz (52 kg)   LMP 06/07/2015   BMI 20.30 kg/m   Wt Readings from Last 3 Encounters:  12/08/21 114 lb 9.6 oz (52 kg)  04/29/21 118 lb 3.2 oz (53.6 kg)  04/20/21 117 lb 3.2 oz (53.2 kg)  Physical exam  Constitutional: Body mass index is 20.3 kg/m., not in acute distress, + anxious state of mind Eyes: PERRLA, EOMI, ++ exophthalmos ENT: moist mucous membranes, +  thyromegaly, no cervical lymphadenopathy Cardiovascular: + Pronounced precordial activity, -tachycardic -patient on propanolol,  no Murmur/Rubs/Gallops Respiratory:  adequate breathing efforts, no gross chest deformity, Clear to auscultation bilaterally Gastrointestinal: abdomen soft, Non -tender, No distension, Bowel Sounds present Musculoskeletal: no gross deformities, strength intact in all four extremities Skin: moist, warm, no rashes Neurological: ++  tremor with outstretched hands,  ++ Deep Tendon Reflexes  on both lower extremities.   CMP     Component Value Date/Time   Ellis 135 06/30/2020 0021   K 4.2 06/30/2020 0021   CL 100 06/30/2020 0021   CO2 25 06/30/2020 0021   GLUCOSE 180 (H) 06/30/2020 0021   BUN 12 06/30/2020 0021   CREATININE 0.44 06/30/2020 0021   CALCIUM 10.1 06/30/2020 0021   PROT 7.5 06/15/2020 0750   ALBUMIN 3.9 06/15/2020 0750   AST 13 (L) 06/15/2020 0750   ALT 19 06/15/2020 0750   ALKPHOS 85 06/15/2020 0750   BILITOT 0.5 06/15/2020 0750   GFRNONAA >60 06/30/2020 0021   GFRAA >60 06/17/2015 2012      CBC    Component Value Date/Time   WBC 17.8 (H) 06/30/2020 0021   RBC 4.82 06/30/2020 0021   HGB 13.2 06/30/2020 0021   HCT 39.8 06/30/2020 0021   PLT 307 06/30/2020 0021   MCV 82.6 06/30/2020 0021   MCH 27.4 06/30/2020 0021   MCHC 33.2 06/30/2020 0021   RDW 13.6 06/30/2020 0021   LYMPHSABS 1.4 06/30/2020 0021   MONOABS 1.0 06/30/2020 0021   EOSABS 0.0 06/30/2020 0021   BASOSABS 0.0 06/30/2020 0021    December April 2022 labs: TSH <0.005, total T4 16.6, free thyroxine index 8.1 elevated 25-hydroxy vitamin D 17.3. Recent Results (from the past 2160 hour(s))  TSH     Status: Abnormal   Collection Time: 11/28/21  2:25 PM  Result Value Ref Range   TSH <0.005 (L) 0.450 - 4.500 uIU/mL  T4, free     Status: Abnormal   Collection Time: 11/28/21  2:25 PM  Result Value Ref Range   Free T4 1.83 (H) 0.82 - 1.77 ng/dL  T3, free     Status: Abnormal   Collection Time: 11/28/21  2:25 PM  Result Value Ref Range   T3, Free 5.0 (H) 2.0 - 4.4 pg/mL  Thyroid peroxidase antibody     Status: Abnormal   Collection Time: 11/28/21  2:25 PM  Result Value Ref Range   Thyroperoxidase Ab SerPl-aCnc 460 (H) 0 - 34 IU/mL  Thyroglobulin antibody     Status: Abnormal   Collection Time: 11/28/21  2:25 PM  Result Value Ref Range   Thyroglobulin Antibody 32.2 (H) 0.0 - 0.9 IU/mL    Comment: Thyroglobulin Antibody measured by [facility] [employer] Methodology    Thyroid uptake and scan on April 19, 2021. FINDINGS: A pyramidal lobe is identified. A cold nodule is seen in the lower pole of the left thyroid lobe.   4 hour I-123 uptake = 58.5% (normal 5-20%)   24 hour I-123 uptake = 63.7% (normal 10-30%)   IMPRESSION: 1. Abnormal increased uptake in the thyroid on 4 hour and 24 imaging consistent with reported history of hyperthyroidism. 2. A cold nodule is seen in the lower pole of the left thyroid lobe. Recommend correlation with ultrasound.  Thyroid ultrasound on April 27, 2021 IMPRESSION:  1. Markedly heterogeneous and potentially hyperemic thyroid, nonspecific though could be seen in the setting of an acute thyroiditis. Clinical correlation is advised. 2. Nodule labeled #6, likely correlating with the cold nodule seen on preceding nuclear medicine thyroid uptake and scan, meets imaging criteria to recommend percutaneous sampling as indicated. 3. None of the additional discretely measured thyroid nodules/pseudo nodules meet imaging criteria to recommend percutaneous sampling or continued dedicated follow-up.     Assessment & Plan:   1. Hyperthyroidism 2.  cold thyroid nodule on left lobe confirmed on ultrasound   her history and most recent labs are reviewed, and she was examined clinically. 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  Through her interpreter,  Alleen Borne, the potential risks of untreated thyrotoxicosis and the need for definitive therapy have been discussed in detail with her, and she agrees to proceed with diagnostic workup and treatment plan. Her uptake and scan confirms hyperthyroidism with 63.7% uptake in 24 hours.  The same scan showed cold nodule which will need further characterization by thyroid ultrasound.  Before this latest treatment is administered, she needs to have biopsy of the suspicious nodule on the left lobe of her thyroid.  The findings of her last thyroid ultrasound :  6 nodules ( 4 on right and 2 on left). Most of the nodules are not suspicious but on nodule on left lobe corresponding to the cold nodule on the uptake and scan is suspicious and radiology is suggesting fine-needle aspiration.  She was hesitant with this decision, during her last visit and she left the country without completing it.  She is approached again and this time she seems to be open.    It would be ordered to be performed at [facility] radiology.  If her biopsy is negative, she  will be considered for I-131 thyroid ablation.  If her biopsy is positive, her option would be surgery.   -Patient is made aware of the high likelihood of post ablative hypothyroidism with subsequent need for lifelong thyroid hormone replacement. she  understands this outcome  and she is  willing to proceed.    -She is advised to return in 3 weeks with her biopsy results.   She is advised to continue propranolol 10 mg p.o. twice daily.  For her vitamin D deficiency: I discussed and prescribed vitamin D 3 5000 units daily x90 days.   -Patient is advised to maintain close follow up with Betty Samples, PA-C for primary care needs.   I spent 32 minutes in the care of the patient today including review of labs from Thyroid Function, CMP, and other relevant labs ; imaging/biopsy records (current and previous including abstractions from other facilities); face-to-face time discussing  her lab results and symptoms, medications doses, her options of short and long term treatment based on the latest standards of care / guidelines;   and documenting the encounter.  Rossie Scarfone and her Spanish language interpreter Alleen Borne participated in the discussions, expressed understanding, and voiced agreement with the above plans.  All questions were answered to her satisfaction. she is encouraged to contact clinic should she have any questions or concerns prior to her return visit.    Follow up plan: Return in about 3 weeks (around 12/29/2021) for F/U with Biopsy Results.   Thank you for involving me in the care of this pleasant patient, and I will continue to update you with her progress.  Glade Lloyd, MD [facility] Phone: [phone]  Fax: [phone]   12/08/2021, 2:02 PM  This note was partially dictated with voice recognition software. Similar sounding words can be transcribed inadequately or may not  be corrected upon review.

## 2021-12-14 ENCOUNTER — Ambulatory Visit (HOSPITAL_COMMUNITY)
Admission: RE | Admit: 2021-12-14 | Discharge: 2021-12-14 | Disposition: A | Discharge status: Home / Self Care | Payer: BC Managed Care – PPO | Source: Ambulatory Visit | Attending: "Endocrinology | Admitting: "Endocrinology

## 2021-12-14 ENCOUNTER — Encounter (HOSPITAL_COMMUNITY): Payer: Self-pay

## 2021-12-14 DIAGNOSIS — E041 Nontoxic single thyroid nodule: Secondary | ICD-10-CM | POA: Insufficient documentation

## 2021-12-14 MED ORDER — LIDOCAINE HCL (PF) 2 % IJ SOLN
INTRAMUSCULAR | Status: AC
  Filled 2021-12-14: qty 10

## 2021-12-14 MED ORDER — LIDOCAINE HCL (PF) 2 % IJ SOLN
10.0000 mL | Freq: Once | INTRAMUSCULAR | Status: AC
  Administered 2021-12-14: 10 mL

## 2021-12-14 NOTE — Progress Notes (Signed)
PT tolerated thyroid biopsy procedure well today. Labs and afirma obtained and sent for pathology. PT ambulatory at discharge with no acute distress noted and verbalized understanding of discharge instructions. 

## 2021-12-15 ENCOUNTER — Ambulatory Visit (HOSPITAL_COMMUNITY): Payer: BC Managed Care – PPO

## 2021-12-15 LAB — CYTOLOGY - NON PAP

## 2021-12-29 ENCOUNTER — Encounter: Payer: Self-pay | Admitting: "Endocrinology

## 2021-12-29 ENCOUNTER — Ambulatory Visit (INDEPENDENT_AMBULATORY_CARE_PROVIDER_SITE_OTHER): Payer: BC Managed Care – PPO | Admitting: "Endocrinology

## 2021-12-29 VITALS — BP 106/78 | HR 92 | Ht 63.0 in | Wt 114.6 lb

## 2021-12-29 DIAGNOSIS — E0520 Thyrotoxicosis with toxic multinodular goiter without thyrotoxic crisis or storm: Secondary | ICD-10-CM

## 2021-12-29 NOTE — Progress Notes (Signed)
12/29/2021      Endocrinology follow-up note Betty Ellis is  not accompanied by her Spanish language interpreter .  Her husband tried to interpret, however this was deemed incomplete or inadequate.    Subjective:    Patient ID: Betty Ellis, female    DOB: 1965/04/12, PCP Betty Samples, PA-C.   Past Medical History:  Diagnosis Date   Acid reflux    Cancer ([place])    stomach, kidney   Diabetes mellitus without complication ([place])    Diverticulitis    Hyperthyroidism     History reviewed. No pertinent surgical history.  Social History   Socioeconomic History   Marital status: Married    Spouse name: Not on file   Number of children: Not on file   Years of education: Not on file   Highest education level: Not on file  Occupational History   Not on file  Tobacco Use   Smoking status: Never   Smokeless tobacco: Never  Vaping Use   Vaping Use: Never used  Substance and Sexual Activity   Alcohol use: No   Drug use: No   Sexual activity: Yes    Birth control/protection: None  Other Topics Concern   Not on file  Social History Narrative   Not on file   Social Determinants of Health   Financial Resource Strain: Not on file  Food Insecurity: Not on file  Transportation Needs: Not on file  Physical Activity: Not on file  Stress: Not on file  Social Connections: Not on file    Family History  Problem Relation Age of Onset   Diabetes Mother    Cancer Father        lung cancer   Cancer Brother        lung cancer    Outpatient Encounter Medications as of 12/29/2021  Medication Sig   COLLAGEN PO Take 2 tablets by mouth daily.   Ferrous Sulfate (IRON PO) Take 1 tablet by mouth daily.   lisinopril (ZESTRIL) 2.5 MG tablet Take 1 tablet by mouth daily.   MAGNESIUM CITRATE PO Take by mouth.   metFORMIN (GLUCOPHAGE) 500 MG tablet 1 tablet 2 (two) times daily.   OVER THE COUNTER MEDICATION Gastrisan 2 tablets tid   OVER THE COUNTER MEDICATION  Fenugreek 2 capsules before each meal   OVER THE COUNTER MEDICATION Spirulina 2 tablets bid   OVER THE COUNTER MEDICATION Active carbon 2 tablet before each meal   OVER THE COUNTER MEDICATION Multivitamin with iron   propranolol (INDERAL) 10 MG tablet Take 10 mg by mouth 2 (two) times daily.   TURMERIC PO Take by mouth.   No facility-administered encounter medications on file as of 12/29/2021.    ALLERGIES: Allergies  Allergen Reactions   Fish Allergy Hives   Penicillins Itching and Rash    Has patient had a PCN reaction causing immediate rash, facial/tongue/throat swelling, SOB or lightheadedness with hypotension: Yes Has patient had a PCN reaction causing severe rash involving mucus membranes or skin necrosis: No Has patient had a PCN reaction that required hospitalization No Has patient had a PCN reaction occurring within the last 10 years: No If all of the above answers are "NO", then may proceed with Cephalosporin use.     VACCINATION STATUS:  There is no immunization history on file for this patient.   HPI  Betty Ellis is 56 y.o. female who presents today with a medical history as above. she is being seen in follow-up  after she was seen in consultation for hyperthyroidism requested by Betty Samples, PA-C.  Patient speaks only [place].  She is accompanied by her interpreter Betty Ellis. She did have a high thyroid uptake and scan confirming primary hyperthyroidism during her last visit.  On this image, she was found to have a cold nodule in the left lobe of her thyroid.  Biopsy was recommended, however patient left the country before she completed the work-up.  She returns with repeat labs showing persistent hyperthyroidism.  She never had a biopsy done.  This needed to happen before administering  Ablative treatment  to treat her hyperthyroidism.  She is reporting that she had an ultrasound done in [place] and was "normal"   she has been dealing with symptoms of  weight loss of 50 pounds over a year, palpitations, tremors, heat intolerance, and anxiety.  She has lost 4 more pounds since last visit. These symptoms are progressively worsening and troubling to her. her most recent thyroid labs revealed suppressed TSH and elevated thyroid hormone levels on February 24, 2021.  She was started on propanolol 10 mg p.o. twice daily which has helped with the palpitations.   she denies dysphagia, choking, shortness of breath, no recent voice change.    Her uptake and scan shows 63% uptake, however cold nodule in the left lobe of her thyroid. She remains on propranolol 10 mg p.o. twice daily. -Her last thyroid ultrasound for better study of her thyroid anatomy. Her thyroid ultrasound shows 6 nodules ( 4 on right and 2 on left). Most of the nodules are not suspicious but on nodule on left lobe corresponding to the cold nodule on the uptake and scan is suspicious and radiology just declined last patient.  She underwent fine-needle aspiration biopsy of this nodule on December 14, 2021 with benign outcomes.  she is not very sure about family history of thyroid dysfunction.  She denies any personal or family history of thyroid malignancy.  she is not on any anti-thyroid medications nor on any thyroid hormone supplements. she  is willing to proceed with appropriate work up and therapy for thyrotoxicosis.                           Review of systems  Constitutional: + weight loss, + fatigue, + subjective hyperthermia Eyes: no blurry vision, + xerophthalmia    Objective:    BP 106/78   Pulse 92   Ht '5\' 3"'$  (1.6 m)   Wt 114 lb 9.6 oz (52 kg)   LMP 06/07/2015   BMI 20.30 kg/m   Wt Readings from Last 3 Encounters:  12/29/21 114 lb 9.6 oz (52 kg)  12/08/21 114 lb 9.6 oz (52 kg)  04/29/21 118 lb 3.2 oz (53.6 kg)                                                Physical exam  Constitutional: Body mass index is 20.3 kg/m., not in acute distress, + anxious state of  mind Eyes: PERRLA, EOMI, ++ exophthalmos ENT: moist mucous membranes, +  thyromegaly, no cervical lymphadenopathy   CMP     Component Value Date/Time   Ellis 135 06/30/2020 0021   K 4.2 06/30/2020 0021   CL 100 06/30/2020 0021   CO2 25 06/30/2020 0021   GLUCOSE 180 (  H) 06/30/2020 0021   BUN 12 06/30/2020 0021   CREATININE 0.44 06/30/2020 0021   CALCIUM 10.1 06/30/2020 0021   PROT 7.5 06/15/2020 0750   ALBUMIN 3.9 06/15/2020 0750   AST 13 (L) 06/15/2020 0750   ALT 19 06/15/2020 0750   ALKPHOS 85 06/15/2020 0750   BILITOT 0.5 06/15/2020 0750   GFRNONAA >60 06/30/2020 0021   GFRAA >60 06/17/2015 2012     CBC    Component Value Date/Time   WBC 17.8 (H) 06/30/2020 0021   RBC 4.82 06/30/2020 0021   HGB 13.2 06/30/2020 0021   HCT 39.8 06/30/2020 0021   PLT 307 06/30/2020 0021   MCV 82.6 06/30/2020 0021   MCH 27.4 06/30/2020 0021   MCHC 33.2 06/30/2020 0021   RDW 13.6 06/30/2020 0021   LYMPHSABS 1.4 06/30/2020 0021   MONOABS 1.0 06/30/2020 0021   EOSABS 0.0 06/30/2020 0021   BASOSABS 0.0 06/30/2020 0021    December April 2022 labs: TSH <0.005, total T4 16.6, free thyroxine index 8.1 elevated 25-hydroxy vitamin D 17.3. Recent Results (from the past 2160 hour(s))  TSH     Status: Abnormal   Collection Time: 11/28/21  2:25 PM  Result Value Ref Range   TSH <0.005 (L) 0.450 - 4.500 uIU/mL  T4, free     Status: Abnormal   Collection Time: 11/28/21  2:25 PM  Result Value Ref Range   Free T4 1.83 (H) 0.82 - 1.77 ng/dL  T3, free     Status: Abnormal   Collection Time: 11/28/21  2:25 PM  Result Value Ref Range   T3, Free 5.0 (H) 2.0 - 4.4 pg/mL  Thyroid peroxidase antibody     Status: Abnormal   Collection Time: 11/28/21  2:25 PM  Result Value Ref Range   Thyroperoxidase Ab SerPl-aCnc 460 (H) 0 - 34 IU/mL  Thyroglobulin antibody     Status: Abnormal   Collection Time: 11/28/21  2:25 PM  Result Value Ref Range   Thyroglobulin Antibody 32.2 (H) 0.0 - 0.9 IU/mL     Comment: Thyroglobulin Antibody measured by [employer] Coulter Methodology  Cytology - Non PAP;     Status: None   Collection Time: 12/14/21  9:27 AM  Result Value Ref Range   CYTOLOGY - NON GYN      CYTOLOGY - NON PAP CASE: [id number] PATIENT: Sueellen Harmon Non-Gynecological Cytology Report     Clinical History: 2.6 cm LLL Specimen Submitted:  A. THYROID, LLL, FINE NEEDLE ASPIRATION:   FINAL MICROSCOPIC DIAGNOSIS: - Scant follicular epithelium present (Bethesda category I)  SPECIMEN ADEQUACY: Satisfactory but limited for evaluation, scant cellularity  GROSS: Received is/are 5 slides in 95% Ethyl alcohol and 30 ccs of pale pink Cytolyt solution. (CM:cm) Prepared: Smears:  5 Concentration Method (Thin Prep):  1 Cell Block:  Cell block attempted, not obtained. Additional Studies:  Also there was an Afirma collected.     Final Diagnosis performed by Tilford Pillar, MD.   Electronically signed 12/15/2021 Technical component performed at [facility], [place] [address] [place], [place] [address].  Professional component performed at [facility], [place] [address], [place], [place] [address].  Immunohistochemistry Te chnical component (if applicable) was performed at [facility]. [address] [place], [place], [place] [address].   IMMUNOHISTOCHEMISTRY DISCLAIMER (if applicable): Some of these immunohistochemical stains may have been developed and the performance characteristics determine by [facility]. Some may not have been cleared or approved by the U.S. Food and  Drug Administration. The FDA has determined that such clearance or approval is not necessary. This test is used for clinical purposes. It should not be regarded as investigational or for research. This laboratory is certified under the [employer] (CLIA-88) as qualified to perform high complexity clinical  laboratory testing.  The controls stained appropriately.     Thyroid uptake and scan on April 19, 2021. FINDINGS: A pyramidal lobe is identified. A cold nodule is seen in the lower pole of the left thyroid lobe.   4 hour I-123 uptake = 58.5% (normal 5-20%)   24 hour I-123 uptake = 63.7% (normal 10-30%)   IMPRESSION: 1. Abnormal increased uptake in the thyroid on 4 hour and 24 imaging consistent with reported history of hyperthyroidism. 2. A cold nodule is seen in the lower pole of the left thyroid lobe. Recommend correlation with ultrasound.  Thyroid ultrasound on April 27, 2021 IMPRESSION: 1. Markedly heterogeneous and potentially hyperemic thyroid, nonspecific though could be seen in the setting of an acute thyroiditis. Clinical correlation is advised. 2. Nodule labeled #6, likely correlating with the cold nodule seen on preceding nuclear medicine thyroid uptake and scan, meets imaging criteria to recommend percutaneous sampling as indicated. 3. None of the additional discretely measured thyroid nodules/pseudo nodules meet imaging criteria to recommend percutaneous sampling or continued dedicated follow-up.     Assessment & Plan:   1. Hyperthyroidism 2.  cold thyroid nodule on left lobe confirmed on ultrasound  In the absence of her Spanish language interpreter, her husband tried to interpret.  However, this was considered inadequate or not satisfactory.  She will need RAI therapy with subsequent RAI induced hypothyroidism.  This will need a great deal of communication with the patient.    She is advised to return in 1 week with [place] Cleophus Molt interpreter.  her history and most recent labs are reviewed, and she was examined clinically. 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  Through her interpreter,  Alleen Borne, the potential risks of untreated thyrotoxicosis and the need for definitive therapy have been  discussed in detail with her, and she agrees to proceed with diagnostic workup and treatment plan. Her uptake and scan confirms hyperthyroidism with 63.7% uptake in 24 hours.  The same scan showed cold nodule which will need further characterization by thyroid ultrasound.  Before this latest treatment is administered, she needed  to have biopsy of the suspicious nodule on the left lobe of her thyroid. She underwent this procedure on December 14, 2021 with benign outcomes.  The findings of her last thyroid ultrasound :  6 nodules ( 4 on right and 2 on left). Most of the nodules are not suspicious but on nodule on left lobe corresponding to the cold nodule on the uptake and scan is suspicious and radiology is suggesting fine-needle aspiration.  She was hesitant with this decision, during her last visit and she left the country without completing it.  She is approached again and this time she seems to be open.    It would be ordered to be performed at [facility] radiology.  If her biopsy is negative, she will be considered for I-131 thyroid ablation.  If her biopsy is positive, her option would be surgery.   -Patient is made aware of the high likelihood of post ablative hypothyroidism with subsequent need for lifelong thyroid hormone replacement. she  understands this outcome  and she is  willing to proceed.  She is advised to continue propranolol 10 mg p.o. twice daily.  For her vitamin D deficiency: I discussed and prescribed vitamin D 3 5000 units daily x90 days.   -Patient is advised to maintain close follow up with Betty Samples, PA-C for primary care needs.   I spent 20 minutes in the care of the patient today including review of labs from Thyroid Function, CMP, and other relevant labs ; imaging/biopsy records (current and previous including abstractions from other facilities); face-to-face time discussing  her lab results and symptoms, medications doses, her options of  short and long term treatment based on the latest standards of care / guidelines;   and documenting the encounter.  Betty Ellis  participated in the discussions, expressed understanding, and voiced agreement with the above plans.  All questions were answered to her satisfaction. she is encouraged to contact clinic should she have any questions or concerns prior to her return visit.   Follow up plan: Return in about 1 week (around 01/05/2022), or Must Return with [employer].   Thank you for involving me in the care of this pleasant patient, and I will continue to update you with her progress.  Glade Lloyd, MD [facility] Phone: [phone]  Fax: [phone]   12/29/2021, 6:10 PM  This note was partially dictated with voice recognition software. Similar sounding words can be transcribed inadequately or may not  be corrected upon review.

## 2022-01-09 ENCOUNTER — Ambulatory Visit (INDEPENDENT_AMBULATORY_CARE_PROVIDER_SITE_OTHER): Payer: BC Managed Care – PPO | Admitting: "Endocrinology

## 2022-01-09 ENCOUNTER — Encounter: Payer: Self-pay | Admitting: "Endocrinology

## 2022-01-09 VITALS — BP 118/66 | HR 76 | Ht 63.0 in | Wt 114.2 lb

## 2022-01-09 DIAGNOSIS — E0520 Thyrotoxicosis with toxic multinodular goiter without thyrotoxic crisis or storm: Secondary | ICD-10-CM

## 2022-01-09 DIAGNOSIS — E559 Vitamin D deficiency, unspecified: Secondary | ICD-10-CM

## 2022-01-09 NOTE — Progress Notes (Signed)
01/09/2022      Endocrinology follow-up note Betty Ellis is  accompanied by her Spanish language interpreter- [name] .    Subjective:    Patient ID: Betty Ellis, female    DOB: 1965/07/19, PCP Jake Samples, PA-C.   Past Medical History:  Diagnosis Date   Acid reflux    Cancer ([place])    stomach, kidney   Diabetes mellitus without complication ([place])    Diverticulitis    Hyperthyroidism     History reviewed. No pertinent surgical history.  Social History   Socioeconomic History   Marital status: Married    Spouse name: Not on file   Number of children: Not on file   Years of education: Not on file   Highest education level: Not on file  Occupational History   Not on file  Tobacco Use   Smoking status: Never   Smokeless tobacco: Never  Vaping Use   Vaping Use: Never used  Substance and Sexual Activity   Alcohol use: No   Drug use: No   Sexual activity: Yes    Birth control/protection: None  Other Topics Concern   Not on file  Social History Narrative   Not on file   Social Determinants of Health   Financial Resource Strain: Not on file  Food Insecurity: Not on file  Transportation Needs: Not on file  Physical Activity: Not on file  Stress: Not on file  Social Connections: Not on file    Family History  Problem Relation Age of Onset   Diabetes Mother    Cancer Father        lung cancer   Cancer Brother        lung cancer    Outpatient Encounter Medications as of 01/09/2022  Medication Sig   COLLAGEN PO Take 2 tablets by mouth daily.   Ferrous Sulfate (IRON PO) Take 1 tablet by mouth daily.   lisinopril (ZESTRIL) 2.5 MG tablet Take 1 tablet by mouth daily.   MAGNESIUM CITRATE PO Take by mouth.   metFORMIN (GLUCOPHAGE) 500 MG tablet 1 tablet 2 (two) times daily.   OVER THE COUNTER MEDICATION Gastrisan 2 tablets tid   OVER THE COUNTER MEDICATION Fenugreek 2 capsules before each meal   OVER THE COUNTER MEDICATION Spirulina 2  tablets bid   OVER THE COUNTER MEDICATION Active carbon 2 tablet before each meal   OVER THE COUNTER MEDICATION Multivitamin with iron   propranolol (INDERAL) 10 MG tablet Take 10 mg by mouth 2 (two) times daily.   TURMERIC PO Take by mouth.   No facility-administered encounter medications on file as of 01/09/2022.    ALLERGIES: Allergies  Allergen Reactions   Fish Allergy Hives   Penicillins Itching and Rash    Has patient had a PCN reaction causing immediate rash, facial/tongue/throat swelling, SOB or lightheadedness with hypotension: Yes Has patient had a PCN reaction causing severe rash involving mucus membranes or skin necrosis: No Has patient had a PCN reaction that required hospitalization No Has patient had a PCN reaction occurring within the last 10 years: No If all of the above answers are "NO", then may proceed with Cephalosporin use.     VACCINATION STATUS:  There is no immunization history on file for this patient.   HPI  Ryn Peine is 56 y.o. female who presents today with a medical history as above. she is being seen in follow-up after she was seen in consultation for hyperthyroidism requested by Jake Samples,  PA-C.  Patient speaks only Spanish.  She is accompanied by her interpreter Alleen Borne . She did have a high thyroid uptake and scan confirming primary hyperthyroidism during her last visit.  On this image, she was found to have a cold nodule in the left lobe of her thyroid.  Biopsy was recommended, however patient left the country before she completed the work-up.  She returns with repeat labs showing persistent hyperthyroidism.  She never had a biopsy done.  This needed to happen before administering  Ablative treatment  to treat her hyperthyroidism.  She is reporting that she had an ultrasound done in [place] and was "normal"   she has been dealing with symptoms of weight loss of 50 pounds over a year, palpitations, tremors, heat intolerance, and anxiety.   She has lost 4 more pounds since last visit. These symptoms are progressively worsening and troubling to her. her most recent thyroid labs revealed suppressed TSH and elevated thyroid hormone levels on February 24, 2021.  She was started on propanolol 10 mg p.o. twice daily which has helped with the palpitations.   she denies dysphagia, choking, shortness of breath, no recent voice change.    Her uptake and scan shows 63% uptake, however cold nodule in the left lobe of her thyroid. She remains on propranolol 10 mg p.o. twice daily. -Her last thyroid ultrasound for better study of her thyroid anatomy. Her thyroid ultrasound shows 6 nodules ( 4 on right and 2 on left). Most of the nodules are not suspicious but on nodule on left lobe corresponding to the cold nodule on the uptake and scan is suspicious and radiology just declined last patient.  She underwent fine-needle aspiration biopsy of this nodule on December 14, 2021 with benign outcomes.  she is not very sure about family history of thyroid dysfunction.  She denies any personal or family history of thyroid malignancy.  she is not on any anti-thyroid medications nor on any thyroid hormone supplements. she  is willing to proceed with appropriate work up and therapy for thyrotoxicosis.                           Review of systems  Constitutional: + weight loss, + fatigue, + subjective hyperthermia Eyes: no blurry vision, + xerophthalmia    Objective:    BP 118/66   Pulse 76   Ht '5\' 3"'$  (1.6 m)   Wt 114 lb 3.2 oz (51.8 kg)   LMP 06/07/2015   BMI 20.23 kg/m   Wt Readings from Last 3 Encounters:  01/09/22 114 lb 3.2 oz (51.8 kg)  12/29/21 114 lb 9.6 oz (52 kg)  12/08/21 114 lb 9.6 oz (52 kg)                                                Physical exam  Constitutional: Body mass index is 20.23 kg/m., not in acute distress, + anxious state of mind Eyes: PERRLA, EOMI, ++ exophthalmos ENT: moist mucous membranes, +  thyromegaly, no  cervical lymphadenopathy   CMP     Component Value Date/Time   Ellis 135 06/30/2020 0021   K 4.2 06/30/2020 0021   CL 100 06/30/2020 0021   CO2 25 06/30/2020 0021   GLUCOSE 180 (H) 06/30/2020 0021   BUN 12 06/30/2020 0021   CREATININE 0.44  06/30/2020 0021   CALCIUM 10.1 06/30/2020 0021   PROT 7.5 06/15/2020 0750   ALBUMIN 3.9 06/15/2020 0750   AST 13 (L) 06/15/2020 0750   ALT 19 06/15/2020 0750   ALKPHOS 85 06/15/2020 0750   BILITOT 0.5 06/15/2020 0750   GFRNONAA >60 06/30/2020 0021   GFRAA >60 06/17/2015 2012     CBC    Component Value Date/Time   WBC 17.8 (H) 06/30/2020 0021   RBC 4.82 06/30/2020 0021   HGB 13.2 06/30/2020 0021   HCT 39.8 06/30/2020 0021   PLT 307 06/30/2020 0021   MCV 82.6 06/30/2020 0021   MCH 27.4 06/30/2020 0021   MCHC 33.2 06/30/2020 0021   RDW 13.6 06/30/2020 0021   LYMPHSABS 1.4 06/30/2020 0021   MONOABS 1.0 06/30/2020 0021   EOSABS 0.0 06/30/2020 0021   BASOSABS 0.0 06/30/2020 0021    December April 2022 labs: TSH <0.005, total T4 16.6, free thyroxine index 8.1 elevated 25-hydroxy vitamin D 17.3. Recent Results (from the past 2160 hour(s))  TSH     Status: Abnormal   Collection Time: 11/28/21  2:25 PM  Result Value Ref Range   TSH <0.005 (L) 0.450 - 4.500 uIU/mL  T4, free     Status: Abnormal   Collection Time: 11/28/21  2:25 PM  Result Value Ref Range   Free T4 1.83 (H) 0.82 - 1.77 ng/dL  T3, free     Status: Abnormal   Collection Time: 11/28/21  2:25 PM  Result Value Ref Range   T3, Free 5.0 (H) 2.0 - 4.4 pg/mL  Thyroid peroxidase antibody     Status: Abnormal   Collection Time: 11/28/21  2:25 PM  Result Value Ref Range   Thyroperoxidase Ab SerPl-aCnc 460 (H) 0 - 34 IU/mL  Thyroglobulin antibody     Status: Abnormal   Collection Time: 11/28/21  2:25 PM  Result Value Ref Range   Thyroglobulin Antibody 32.2 (H) 0.0 - 0.9 IU/mL    Comment: Thyroglobulin Antibody measured by Beckman Coulter Methodology  Cytology - Non PAP;      Status: None   Collection Time: 12/14/21  9:27 AM  Result Value Ref Range   CYTOLOGY - NON GYN      CYTOLOGY - NON PAP CASE: [id number] PATIENT: Rylinn Slight Non-Gynecological Cytology Report     Clinical History: 2.6 cm LLL Specimen Submitted:  A. THYROID, LLL, FINE NEEDLE ASPIRATION:   FINAL MICROSCOPIC DIAGNOSIS: - Scant follicular epithelium present (Bethesda category I)  SPECIMEN ADEQUACY: Satisfactory but limited for evaluation, scant cellularity  GROSS: Received is/are 5 slides in 95% Ethyl alcohol and 30 ccs of pale pink Cytolyt solution. (CM:cm) Prepared: Smears:  5 Concentration Method (Thin Prep):  1 Cell Block:  Cell block attempted, not obtained. Additional Studies:  Also there was an Afirma collected.     Final Diagnosis performed by Tilford Pillar, MD.   Electronically signed 12/15/2021 Technical component performed at [facility], [place] [address], [place], [place] [address].  Professional component performed at [facility], [place] [address], [place], [place] [address].  Immunohistochemistry Te chnical component (if applicable) was performed at [facility]. [address] [place], [place], [place] [address].   IMMUNOHISTOCHEMISTRY DISCLAIMER (if applicable): Some of these immunohistochemical stains may have been developed and the performance characteristics determine by [facility]. Some may not have been cleared or approved by the U.S. Food and Drug Administration. The FDA has determined that such clearance or approval is not  necessary. This test is used for clinical purposes. It should not be regarded as investigational or for research. This laboratory is certified under the [place] (CLIA-88) as qualified to perform high complexity clinical laboratory testing.  The controls stained appropriately.     Thyroid uptake and scan on  April 19, 2021. FINDINGS: A pyramidal lobe is identified. A cold nodule is seen in the lower pole of the left thyroid lobe.   4 hour I-123 uptake = 58.5% (normal 5-20%)   24 hour I-123 uptake = 63.7% (normal 10-30%)   IMPRESSION: 1. Abnormal increased uptake in the thyroid on 4 hour and 24 imaging consistent with reported history of hyperthyroidism. 2. A cold nodule is seen in the lower pole of the left thyroid lobe. Recommend correlation with ultrasound.  Thyroid ultrasound on April 27, 2021 IMPRESSION: 1. Markedly heterogeneous and potentially hyperemic thyroid, nonspecific though could be seen in the setting of an acute thyroiditis. Clinical correlation is advised. 2. Nodule labeled #6, likely correlating with the cold nodule seen on preceding nuclear medicine thyroid uptake and scan, meets imaging criteria to recommend percutaneous sampling as indicated. 3. None of the additional discretely measured thyroid nodules/pseudo nodules meet imaging criteria to recommend percutaneous sampling or continued dedicated follow-up.     Assessment & Plan:   1. Hyperthyroidism 2.  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 She presents with her Spanish language interpreter - Alleen Borne- today.    her history and most recent labs and imaging as well as biopsy results are reviewed, reinterpreted for her.  And she was examined clinically. 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  Through her interpreter,  Alleen Borne, the potential risks of untreated thyrotoxicosis and the need for definitive therapy have been discussed in detail with her, and she agrees to proceed with diagnostic workup and treatment plan. Her uptake and scan confirms hyperthyroidism with 63.7% uptake in 24 hours.  The same scan showed cold nodule which will need further characterization by thyroid ultrasound. The findings of her last  thyroid ultrasound :  6 nodules ( 4 on right and 2 on left). Most of the nodules are not suspicious but on nodule on left lobe corresponding to the cold nodule on the uptake and scan is suspicious and radiology is suggesting fine-needle aspiration.  Before this latest treatment is administered, she needed  to have biopsy of the suspicious nodule on the left lobe of her thyroid. She underwent this procedure on December 14, 2021 with benign outcomes.  Options were discussed again.  She agrees with my recommendation of radiology thyroid ablation.  This treatment will be administered in the next several days in the [facility]. -Patient is made aware of the high likelihood of post ablative hypothyroidism with subsequent need for lifelong thyroid hormone replacement. she  understands this outcome  and she is  willing to proceed.    She is advised to continue propranolol 10 mg p.o. twice daily.  For her vitamin D deficiency: I discussed and prescribed vitamin D 3 5000 units daily x90 days.   -Patient is advised to maintain close follow up with Jake Samples, PA-C for primary care needs.   I spent 32 minutes in the care of the patient today including review of labs from Thyroid Function, CMP, and other relevant labs ; imaging/biopsy records (current and previous including abstractions from other facilities); face-to-face time discussing  her lab results and symptoms,  medications doses, her options of short and long term treatment based on the latest standards of care / guidelines;   and documenting the encounter.  Betty Ellis as well as her interpreter Alleen Borne participated in the discussions, expressed understanding, and voiced agreement with the above plans.  All questions were answered to her satisfaction. she is encouraged to contact clinic should she have any questions or concerns prior to her return visit.  Follow up plan: Return in about 9 weeks (around 03/13/2022) for F/U with  Labs after I131 Therapy.   Thank you for involving me in the care of this pleasant patient, and I will continue to update you with her progress.  Glade Lloyd, MD [facility] Phone: [phone]  Fax: [phone]   01/09/2022, 7:03 PM  This note was partially dictated with voice recognition software. Similar sounding words can be transcribed inadequately or may not  be corrected upon review.

## 2022-01-10 DIAGNOSIS — E0520 Thyrotoxicosis with toxic multinodular goiter without thyrotoxic crisis or storm: Secondary | ICD-10-CM | POA: Insufficient documentation

## 2022-01-11 NOTE — Written Directive (Addendum)
MOLECULAR IMAGING AND THERAPEUTICS WRITTEN DIRECTIVE   PATIENT NAME: Betty Ellis  PT DOB:   10-24-1965                                              MRN: [id number]  ---------------------------------------------------------------------------------------------------------------------   I-131 WHOLE THYROID THERAPY (NON-CANCER)    RADIOPHARMACEUTICAL:   Iodine-131 Capsule    PRESCRIBED DOSE FOR ADMINISTRATION:  16 mCi  (sixteen milliCuries)   ROUTE OFADMINISTRATION: PO   DIAGNOSIS:  Graves Disease   REFERRING PHYSICIAN: Dr. Dorris Fetch   TSH:    Lab Results  Component Value Date   TSH <0.005 (L) 11/28/2021   TSH 0.01 (A) 02/24/2021     PRIOR I-131 THERAPY (Date and Dose): N/A   PRIOR RADIOLOGY EXAMS (Results and Date): US THYROID  Result Date: 04/27/2021 CLINICAL DATA:  Incidental on other study. Cold nodule within the lower pole of the left lobe of the thyroid. History of hyperthyroidism. EXAM: THYROID ULTRASOUND TECHNIQUE: Ultrasound examination of the thyroid gland and adjacent soft tissues was performed. COMPARISON:  Nuclear medicine thyroid uptake and scan-04/19/2021 FINDINGS: Parenchymal Echotexture: Markedly heterogenous - suspected mild diffuse glandular hyperemia (images 3, 20, 21 and 37) Isthmus: Normal in size measuring 0.6 cm in diameter Right lobe: Normal in size measuring 4.4 x 1.9 x 1.8 cm Left lobe: Normal in size measuring 4.9 x 2.3 x 1.7 cm _________________________________________________________ Estimated total number of nodules >/= 1 cm: 4 Number of spongiform nodules >/=  2 cm not described below (TR1): 0 Number of mixed cystic and solid nodules >/= 1.5 cm not described below (TR2): 0 _________________________________________________________ There is an approximately 1.0 x 0.9 x 0.7 cm isoechoic ill-defined nodule/pseudonodule within the right-sided the thyroid isthmus (labeled 1), which does not meet imaging criteria to recommend percutaneous sampling or  continued dedicated follow-up. _________________________________________________________ There is an approximately 0.8 x 0.7 x 0.6 cm hyperechoic ill-defined nodule/pseudonodule within mid, medial aspect of the right lobe of the thyroid (labeled 2), which does not meet criteria to recommend percutaneous sampling or continued dedicated follow-up. There is an approximately 0.9 x 0.8 x 0.6 cm hyperechoic ill-defined nodule/pseudonodule within the mid, lateral aspect of the right lobe of the thyroid (labeled 3), which does not meet imaging criteria to recommend percutaneous sampling or continued dedicated follow-up. Questioned 1.3 x 1.3 x 0.9 cm isoechoic nodule/pseudonodule within the inferior pole the right lobe of the thyroid (labeled 4), does not meet criteria to recommend percutaneous sampling or continued dedicated follow-up. _________________________________________________________ [name] approximately 1.0 x 0.8 x 0.8 cm isoechoic ill-defined nodule/pseudonodule within the superior pole the left lobe of the thyroid (labeled 5), does not meet criteria to recommend percutaneous sampling or continued dedicated follow-up. _________________________________________________________ Nodule # 6: Location: Left; Inferior - likely correlates with the cold nodule seen on preceding nuclear medicine thyroid uptake and scan Maximum size: 2.6 cm; Other 2 dimensions: 2.4 x 1.8 cm Composition: solid/almost completely solid (2) Echogenicity: hyperechoic (1) Shape: taller-than-wide (3) Margins: lobulated/irregular (2) Echogenic foci: none (0) ACR TI-RADS total points: 8. ACR TI-RADS risk category: TR5 (>/= 7 points). ACR TI-RADS recommendations: **Given size (>/= 1.0 cm) and appearance, fine needle aspiration of this highly suspicious nodule should be considered based on TI-RADS criteria. _________________________________________________________ IMPRESSION: 1. Markedly heterogeneous and potentially hyperemic thyroid, nonspecific  though could be seen in the setting of an acute thyroiditis. Clinical correlation is advised. 2.  Nodule labeled #6, likely correlating with the cold nodule seen on preceding nuclear medicine thyroid uptake and scan, meets imaging criteria to recommend percutaneous sampling as indicated. 3. None of the additional discretely measured thyroid nodules/pseudo nodules meet imaging criteria to recommend percutaneous sampling or continued dedicated follow-up. The above is in keeping with the ACR TI-RADS recommendations - J Am Coll Radiol 2017;14:587-595. Electronically Signed   By: Sandi Mariscal M.D.   On: 04/27/2021 12:44   NM THYROID MULT UPTAKE W/IMAGING  Result Date: 04/19/2021 CLINICAL DATA:  Hyperthyroidism. EXAM: THYROID SCAN AND UPTAKE - 4 AND 24 HOURS TECHNIQUE: Following oral administration of I-123 capsule, anterior planar imaging was acquired at 24 hours. Thyroid uptake was calculated with a thyroid probe at 4-6 hours and 24 hours. RADIOPHARMACEUTICALS:  Four hundred 67 uCi I-123 sodium iodide p.o. COMPARISON:  None. FINDINGS: A pyramidal lobe is identified. A cold nodule is seen in the lower pole of the left thyroid lobe. 4 hour I-123 uptake = 58.5% (normal 5-20%) 24 hour I-123 uptake = 63.7% (normal 10-30%) IMPRESSION: 1. Abnormal increased uptake in the thyroid on 4 hour and 24 imaging consistent with reported history of hyperthyroidism. 2. A cold nodule is seen in the lower pole of the left thyroid lobe. Recommend correlation with ultrasound. Electronically Signed   By: Dorise Bullion III M.D.   On: 04/19/2021 12:25      ADDITIONAL PHYSICIAN COMMENTS/NOTES   AUTHORIZED USER SIGNATURE & TIME STAMP:

## 2022-01-17 ENCOUNTER — Encounter: Payer: Self-pay | Admitting: "Endocrinology

## 2022-01-27 ENCOUNTER — Ambulatory Visit (HOSPITAL_COMMUNITY)
Admission: RE | Admit: 2022-01-27 | Discharge: 2022-01-27 | Disposition: A | Discharge status: Home / Self Care | Payer: BC Managed Care – PPO | Source: Ambulatory Visit | Attending: "Endocrinology | Admitting: "Endocrinology

## 2022-01-27 ENCOUNTER — Encounter (HOSPITAL_COMMUNITY): Payer: Self-pay

## 2022-01-27 DIAGNOSIS — E0520 Thyrotoxicosis with toxic multinodular goiter without thyrotoxic crisis or storm: Secondary | ICD-10-CM | POA: Diagnosis not present

## 2022-01-27 DIAGNOSIS — E0590 Thyrotoxicosis, unspecified without thyrotoxic crisis or storm: Secondary | ICD-10-CM | POA: Diagnosis not present

## 2022-01-27 MED ORDER — SODIUM IODIDE I 131 CAPSULE
16.0000 | Freq: Once | INTRAVENOUS | Status: AC | PRN
  Administered 2022-01-27: 16.35 via ORAL

## 2022-03-10 DIAGNOSIS — R946 Abnormal results of thyroid function studies: Secondary | ICD-10-CM | POA: Diagnosis not present

## 2022-03-10 DIAGNOSIS — E7849 Other hyperlipidemia: Secondary | ICD-10-CM | POA: Diagnosis not present

## 2022-03-10 DIAGNOSIS — E119 Type 2 diabetes mellitus without complications: Secondary | ICD-10-CM | POA: Diagnosis not present

## 2022-03-10 DIAGNOSIS — Z6820 Body mass index (BMI) 20.0-20.9, adult: Secondary | ICD-10-CM | POA: Diagnosis not present

## 2022-03-15 ENCOUNTER — Ambulatory Visit: Payer: BC Managed Care – PPO | Admitting: "Endocrinology

## 2022-03-15 DIAGNOSIS — E0520 Thyrotoxicosis with toxic multinodular goiter without thyrotoxic crisis or storm: Secondary | ICD-10-CM | POA: Diagnosis not present

## 2022-03-16 LAB — T4, FREE: Free T4: 2.23 ng/dL — ABNORMAL HIGH (ref 0.82–1.77)

## 2022-03-16 LAB — TSH: TSH: <0.005 u[IU]/mL — ABNORMAL LOW (ref 0.450–4.500)

## 2022-03-16 LAB — T3, FREE: T3, Free: 5.7 pg/mL — ABNORMAL HIGH (ref 2.0–4.4)

## 2022-03-23 ENCOUNTER — Encounter: Payer: Self-pay | Admitting: "Endocrinology

## 2022-03-23 ENCOUNTER — Ambulatory Visit: Payer: BC Managed Care – PPO | Admitting: "Endocrinology

## 2022-03-23 VITALS — BP 96/62 | HR 88 | Ht 63.0 in | Wt 116.0 lb

## 2022-03-23 DIAGNOSIS — E119 Type 2 diabetes mellitus without complications: Secondary | ICD-10-CM | POA: Insufficient documentation

## 2022-03-23 DIAGNOSIS — E0520 Thyrotoxicosis with toxic multinodular goiter without thyrotoxic crisis or storm: Secondary | ICD-10-CM

## 2022-03-23 NOTE — Progress Notes (Signed)
03/23/2022      Endocrinology follow-up note Betty Ellis is  accompanied by her Spanish language interpreter- Tacey Heap.  Subjective:    Patient ID: Betty Ellis, female    DOB: [birth year], PCP Jake Samples, PA-C.   Past Medical History:  Diagnosis Date   Acid reflux    Cancer ([place])    stomach, kidney   Diabetes mellitus without complication ([place])    Diverticulitis    Hyperthyroidism     History reviewed. No pertinent surgical history.  Social History   Socioeconomic History   Marital status: Married    Spouse name: Not on file   Number of children: Not on file   Years of education: Not on file   Highest education level: Not on file  Occupational History   Not on file  Tobacco Use   Smoking status: Never   Smokeless tobacco: Never  Vaping Use   Vaping Use: Never used  Substance and Sexual Activity   Alcohol use: No   Drug use: No   Sexual activity: Yes    Birth control/protection: None  Other Topics Concern   Not on file  Social History Narrative   Not on file   Social Determinants of Health   Financial Resource Strain: Not on file  Food Insecurity: Not on file  Transportation Needs: Not on file  Physical Activity: Not on file  Stress: Not on file  Social Connections: Not on file    Family History  Problem Relation Age of Onset   Diabetes Mother    Cancer Father        lung cancer   Cancer Brother        lung cancer    Outpatient Encounter Medications as of 03/23/2022  Medication Sig   COLLAGEN PO Take 2 tablets by mouth daily.   lisinopril (ZESTRIL) 2.5 MG tablet Take 1 tablet by mouth daily.   MAGNESIUM CITRATE PO Take by mouth.   metFORMIN (GLUCOPHAGE) 500 MG tablet 1 tablet 2 (two) times daily.   OVER THE COUNTER MEDICATION Fenugreek 2 capsules before each meal   OVER THE COUNTER MEDICATION Active carbon 2 tablet before each meal   [DISCONTINUED] Ferrous Sulfate (IRON PO) Take 1 tablet by mouth daily.    [DISCONTINUED] OVER THE COUNTER MEDICATION Gastrisan 2 tablets tid   [DISCONTINUED] OVER THE COUNTER MEDICATION Spirulina 2 tablets bid   [DISCONTINUED] OVER THE COUNTER MEDICATION Multivitamin with iron   [DISCONTINUED] propranolol (INDERAL) 10 MG tablet Take 10 mg by mouth 2 (two) times daily.   [DISCONTINUED] TURMERIC PO Take by mouth.   No facility-administered encounter medications on file as of 03/23/2022.    ALLERGIES: Allergies  Allergen Reactions   Fish Allergy Hives   Penicillins Itching and Rash    Has patient had a PCN reaction causing immediate rash, facial/tongue/throat swelling, SOB or lightheadedness with hypotension: Yes Has patient had a PCN reaction causing severe rash involving mucus membranes or skin necrosis: No Has patient had a PCN reaction that required hospitalization No Has patient had a PCN reaction occurring within the last 10 years: No If all of the above answers are "NO", then may proceed with Cephalosporin use.     VACCINATION STATUS:  There is no immunization history on file for this patient.   HPI  Betty Ellis is 57 y.o. female who presents today with a medical history as above. she is being seen in follow-up after she was seen in consultation for hyperthyroidism requested  by Jake Samples, PA-C.  Patient speaks only [place].  She is accompanied by her interpreter [place]. She did have a high thyroid uptake and scan confirming primary hyperthyroidism during her last visit.  On this image, she was found to have a cold nodule in the left lobe of her thyroid.  Biopsy was recommended, however patient left the country before she completed the work-up.  She returns with repeat labs showing persistent hyperthyroidism.  She never had a biopsy done.  This needed to happen before administering  Ablative treatment  to treat her hyperthyroidism.  She is reporting that she had an ultrasound done in [place] and was "normal"   she has been dealing with  symptoms of weight loss of 50 pounds over a year, palpitations, tremors, heat intolerance, and anxiety.  She has lost 4 more pounds since last visit. These symptoms are progressively worsening and troubling to her. her most recent thyroid labs revealed suppressed TSH and elevated thyroid hormone levels on February 24, 2021.  She was started on propanolol 10 mg p.o. twice daily which has helped with the palpitations.   she denies dysphagia, choking, shortness of breath, no recent voice change.    Her uptake and scan shows 63% uptake, however cold nodule in the left lobe of her thyroid. She remains on propranolol 10 mg p.o. twice daily. -Her last thyroid ultrasound for better study of her thyroid anatomy. Her thyroid ultrasound shows 6 nodules ( 4 on right and 2 on left). Most of the nodules are not suspicious but on nodule on left lobe corresponding to the cold nodule on the uptake and scan is suspicious and radiology just declined last patient.  She underwent fine-needle aspiration biopsy of this nodule on December 14, 2021 with benign outcomes.  she is not very sure about family history of thyroid dysfunction.  She denies any personal or family history of thyroid malignancy.  After several months to a year of hesitance, during her last visit, she accepted treatment with radioactive iodine which was given to her on January 27, 2022.  She reports symptomatic improvement, however her thyroid function test from March 15, 2022 still show high levels of Thyroid hormones.                           Review of systems  Constitutional: + weight loss, + fatigue, + subjective hyperthermia Eyes: no blurry vision, + xerophthalmia    Objective:    BP 96/62   Pulse 88   Ht '5\' 3"'$  (1.6 m)   Wt 116 lb (52.6 kg)   LMP 06/07/2015   BMI 20.55 kg/m   Wt Readings from Last 3 Encounters:  03/23/22 116 lb (52.6 kg)  01/09/22 114 lb 3.2 oz (51.8 kg)  12/29/21 114 lb 9.6 oz (52 kg)                                                 Physical exam  Constitutional: Body mass index is 20.55 kg/m., not in acute distress, + anxious state of mind Eyes: PERRLA, EOMI, ++ exophthalmos ENT: moist mucous membranes, +  thyromegaly, no cervical lymphadenopathy   CMP     Component Value Date/Time   Ellis 135 06/30/2020 0021   K 4.2 06/30/2020 0021   CL 100 06/30/2020 0021   CO2  25 06/30/2020 0021   GLUCOSE 180 (H) 06/30/2020 0021   BUN 12 06/30/2020 0021   CREATININE 0.44 06/30/2020 0021   CALCIUM 10.1 06/30/2020 0021   PROT 7.5 06/15/2020 0750   ALBUMIN 3.9 06/15/2020 0750   AST 13 (L) 06/15/2020 0750   ALT 19 06/15/2020 0750   ALKPHOS 85 06/15/2020 0750   BILITOT 0.5 06/15/2020 0750   GFRNONAA >60 06/30/2020 0021   GFRAA >60 06/17/2015 2012     CBC    Component Value Date/Time   WBC 17.8 (H) 06/30/2020 0021   RBC 4.82 06/30/2020 0021   HGB 13.2 06/30/2020 0021   HCT 39.8 06/30/2020 0021   PLT 307 06/30/2020 0021   MCV 82.6 06/30/2020 0021   MCH 27.4 06/30/2020 0021   MCHC 33.2 06/30/2020 0021   RDW 13.6 06/30/2020 0021   LYMPHSABS 1.4 06/30/2020 0021   MONOABS 1.0 06/30/2020 0021   EOSABS 0.0 06/30/2020 0021   BASOSABS 0.0 06/30/2020 0021    December April 2022 labs: TSH <0.005, total T4 16.6, free thyroxine index 8.1 elevated 25-hydroxy vitamin D 17.3. Recent Results (from the past 2160 hour(s))  TSH     Status: Abnormal   Collection Time: 03/15/22 10:56 AM  Result Value Ref Range   TSH <0.005 (L) 0.450 - 4.500 uIU/mL  T4, free     Status: Abnormal   Collection Time: 03/15/22 10:56 AM  Result Value Ref Range   Free T4 2.23 (H) 0.82 - 1.77 ng/dL  T3, free     Status: Abnormal   Collection Time: 03/15/22 10:56 AM  Result Value Ref Range   T3, Free 5.7 (H) 2.0 - 4.4 pg/mL    Thyroid uptake and scan on April 19, 2021. FINDINGS: A pyramidal lobe is identified. A cold nodule is seen in the lower pole of the left thyroid lobe.   4 hour I-123 uptake = 58.5% (normal  5-20%)   24 hour I-123 uptake = 63.7% (normal 10-30%)   IMPRESSION: 1. Abnormal increased uptake in the thyroid on 4 hour and 24 imaging consistent with reported history of hyperthyroidism. 2. A cold nodule is seen in the lower pole of the left thyroid lobe. Recommend correlation with ultrasound.  Thyroid ultrasound on April 27, 2021 IMPRESSION: 1. Markedly heterogeneous and potentially hyperemic thyroid, nonspecific though could be seen in the setting of an acute thyroiditis. Clinical correlation is advised. 2. Nodule labeled #6, likely correlating with the cold nodule seen on preceding nuclear medicine thyroid uptake and scan, meets imaging criteria to recommend percutaneous sampling as indicated. 3. None of the additional discretely measured thyroid nodules/pseudo nodules meet imaging criteria to recommend percutaneous sampling or continued dedicated follow-up.  01/27/22 RADIOPHARMACEUTICALS:  16.35 mCi I-131 sodium iodide orally   IMPRESSION: Per oral administration of I-131 sodium iodide for the treatment of hyperthyroidism.  Assessment & Plan:   1. Hyperthyroidism 2.  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 She presents with her Spanish language interpreter - Alleen Borne- today.    her history and most recent labs and imaging as well as biopsy results are reviewed, reinterpreted for her.  And she was examined clinically. 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  Through her interpreter,  Alleen Borne, the potential risks of untreated thyrotoxicosis and the need for definitive therapy have been discussed in detail with her, and she agrees to proceed with diagnostic workup and treatment plan. Her uptake and scan confirms hyperthyroidism with 63.7% uptake in 24  hours.  The same scan showed cold nodule which will need further characterization by thyroid ultrasound. The findings of her last  thyroid ultrasound :  6 nodules ( 4 on right and 2 on left). Most of the nodules are not suspicious but on nodule on left lobe corresponding to the cold nodule on the uptake and scan is suspicious and radiology is suggesting fine-needle aspiration.  Before this latest treatment is administered, she needed  to have biopsy of the suspicious nodule on the left lobe of her thyroid. She underwent this procedure on December 14, 2021 with benign outcomes.  She is status post RAI thyroid ablation on January 27, 2022, however did not develop hypothyroidism yet.  She will have repeat labs in 4 weeks with office visit in 5 weeks.  She is running low blood pressure, advised to discontinue lisinopril.  She was recently taken off of her beta-blocker.  For her vitamin D deficiency: I discussed and prescribed vitamin D 3 5000 units daily x90 days.   -Patient is advised to maintain close follow up with Jake Samples, PA-C for primary care needs.  She is advised to continue her metformin 500 mg p.o. twice daily for diabetes.    I spent 26 minutes in the care of the patient today including review of labs from Thyroid Function, CMP, and other relevant labs ; imaging/biopsy records (current and previous including abstractions from other facilities); face-to-face time discussing  her lab results and symptoms, medications doses, her options of short and long term treatment based on the latest standards of care / guidelines;   and documenting the encounter.  Betty Ellis  and her interpretor Tacey Heap participated in the discussions, expressed understanding, and voiced agreement with the above plans.  All questions were answered to her satisfaction. she is encouraged to contact clinic should she have any questions or concerns prior to her return visit.   Follow up plan: Return in about 5 weeks (around 04/27/2022) for F/U with Pre-visit Labs.   Thank you for involving me in the care of this pleasant  patient, and I will continue to update you with her progress.  Glade Lloyd, MD [facility] Phone: [phone]  Fax: [phone]   03/23/2022, 5:35 PM  This note was partially dictated with voice recognition software. Similar sounding words can be transcribed inadequately or may not  be corrected upon review.

## 2022-04-19 DIAGNOSIS — E0520 Thyrotoxicosis with toxic multinodular goiter without thyrotoxic crisis or storm: Secondary | ICD-10-CM | POA: Diagnosis not present

## 2022-04-20 LAB — T3, FREE: T3, Free: 1.1 pg/mL — ABNORMAL LOW (ref 2.0–4.4)

## 2022-04-20 LAB — TSH: TSH: 1.63 u[IU]/mL (ref 0.450–4.500)

## 2022-04-20 LAB — T4, FREE: Free T4: 0.4 ng/dL — ABNORMAL LOW (ref 0.82–1.77)

## 2022-04-28 ENCOUNTER — Encounter: Payer: Self-pay | Admitting: "Endocrinology

## 2022-04-28 ENCOUNTER — Ambulatory Visit: Payer: BC Managed Care – PPO | Admitting: "Endocrinology

## 2022-04-28 VITALS — BP 114/76 | HR 68 | Ht 63.0 in | Wt 124.2 lb

## 2022-04-28 DIAGNOSIS — E890 Postprocedural hypothyroidism: Secondary | ICD-10-CM

## 2022-04-28 MED ORDER — LEVOTHYROXINE SODIUM 50 MCG PO TABS: 50.0000 ug | ORAL_TABLET | Freq: Every day | ORAL | 1 refills | Status: DC

## 2022-04-28 NOTE — Progress Notes (Unsigned)
04/28/2022      Endocrinology follow-up note Betty Ellis is  accompanied by her Spanish language interpreter- Betty Ellis.  Subjective:    Patient ID: Betty Ellis, female    DOB: [birth year], PCP Jake Samples, PA-C.   Past Medical History:  Diagnosis Date   Acid reflux    Cancer ([place])    stomach, kidney   Diabetes mellitus without complication ([place])    Diverticulitis    Hyperthyroidism     History reviewed. No pertinent surgical history.  Social History   Socioeconomic History   Marital status: Married    Spouse name: Not on file   Number of children: Not on file   Years of education: Not on file   Highest education level: Not on file  Occupational History   Not on file  Tobacco Use   Smoking status: Never   Smokeless tobacco: Never  Vaping Use   Vaping Use: Never used  Substance and Sexual Activity   Alcohol use: No   Drug use: No   Sexual activity: Yes    Birth control/protection: None  Other Topics Concern   Not on file  Social History Narrative   Not on file   Social Determinants of Health   Financial Resource Strain: Not on file  Food Insecurity: Not on file  Transportation Needs: Not on file  Physical Activity: Not on file  Stress: Not on file  Social Connections: Not on file    Family History  Problem Relation Age of Onset   Diabetes Mother    Cancer Father        lung cancer   Cancer Brother        lung cancer    Outpatient Encounter Medications as of 04/28/2022  Medication Sig   levothyroxine (SYNTHROID) 50 MCG tablet Take 1 tablet (50 mcg total) by mouth daily before breakfast.   lisinopril (ZESTRIL) 2.5 MG tablet Take 2.5 mg by mouth daily.   COLLAGEN PO Take 2 tablets by mouth daily.   MAGNESIUM CITRATE PO Take by mouth.   metFORMIN (GLUCOPHAGE) 500 MG tablet 1 tablet 2 (two) times daily.   OVER THE COUNTER MEDICATION Fenugreek 2 capsules before each meal   OVER THE COUNTER MEDICATION Active carbon 2 tablet  before each meal   No facility-administered encounter medications on file as of 04/28/2022.    ALLERGIES: Allergies  Allergen Reactions   Fish Allergy Hives   Penicillins Itching and Rash    Has patient had a PCN reaction causing immediate rash, facial/tongue/throat swelling, SOB or lightheadedness with hypotension: Yes Has patient had a PCN reaction causing severe rash involving mucus membranes or skin necrosis: No Has patient had a PCN reaction that required hospitalization No Has patient had a PCN reaction occurring within the last 10 years: No If all of the above answers are "NO", then may proceed with Cephalosporin use.     VACCINATION STATUS:  There is no immunization history on file for this patient.   HPI  Betty Ellis is 57 y.o. female who presents today with a medical history as above. she is being seen in follow-up after she was seen in consultation for hyperthyroidism requested by Jake Samples, PA-C.  Patient speaks only [place].  She is accompanied by her interpreter Betty Ellis and her Husband. After appropriate workup for hypothyroidism which confirmed primary hypothyroidism due to Graves' disease, she was sent for radioactive iodine thyroid ablation. This treatment was administered in November, January 27, 2022.  She returns with treatment effect and clinical hypothyroidism.  See notes from her previous visits.  Her previous symptoms of hyperthyroidism have resolved. she denies dysphagia, choking, shortness of breath, no recent voice change.    Her uptake and scan shows 63% uptake, however cold nodule in the left lobe of her thyroid. she is not very sure about family history of thyroid dysfunction.  She denies any personal or family history of thyroid malignancy.                            Review of systems  Constitutional: + weight gain, + fatigue, + subjective hyperthermia Eyes: no blurry vision, + xerophthalmia    Objective:    BP 114/76   Pulse 68    Ht 5' 3"$  (1.6 m)   Wt 124 lb 3.2 oz (56.3 kg)   LMP 06/07/2015   BMI 22.00 kg/m   Wt Readings from Last 3 Encounters:  04/28/22 124 lb 3.2 oz (56.3 kg)  03/23/22 116 lb (52.6 kg)  01/09/22 114 lb 3.2 oz (51.8 kg)                                                Physical exam  Constitutional: Body mass index is 22 kg/m., not in acute distress, + anxious state of mind Eyes: PERRLA, EOMI, ++ exophthalmos ENT: moist mucous membranes, +  thyromegaly, no cervical lymphadenopathy   CMP     Component Value Date/Time   Ellis 135 06/30/2020 0021   K 4.2 06/30/2020 0021   CL 100 06/30/2020 0021   CO2 25 06/30/2020 0021   GLUCOSE 180 (H) 06/30/2020 0021   BUN 12 06/30/2020 0021   CREATININE 0.44 06/30/2020 0021   CALCIUM 10.1 06/30/2020 0021   PROT 7.5 06/15/2020 0750   ALBUMIN 3.9 06/15/2020 0750   AST 13 (L) 06/15/2020 0750   ALT 19 06/15/2020 0750   ALKPHOS 85 06/15/2020 0750   BILITOT 0.5 06/15/2020 0750   GFRNONAA >60 06/30/2020 0021   GFRAA >60 06/17/2015 2012     CBC    Component Value Date/Time   WBC 17.8 (H) 06/30/2020 0021   RBC 4.82 06/30/2020 0021   HGB 13.2 06/30/2020 0021   HCT 39.8 06/30/2020 0021   PLT 307 06/30/2020 0021   MCV 82.6 06/30/2020 0021   MCH 27.4 06/30/2020 0021   MCHC 33.2 06/30/2020 0021   RDW 13.6 06/30/2020 0021   LYMPHSABS 1.4 06/30/2020 0021   MONOABS 1.0 06/30/2020 0021   EOSABS 0.0 06/30/2020 0021   BASOSABS 0.0 06/30/2020 0021    December April 2022 labs: TSH <0.005, total T4 16.6, free thyroxine index 8.1 elevated 25-hydroxy vitamin D 17.3. Recent Results (from the past 2160 hour(s))  TSH     Status: Abnormal   Collection Time: 03/15/22 10:56 AM  Result Value Ref Range   TSH <0.005 (L) 0.450 - 4.500 uIU/mL  T4, free     Status: Abnormal   Collection Time: 03/15/22 10:56 AM  Result Value Ref Range   Free T4 2.23 (H) 0.82 - 1.77 ng/dL  T3, free     Status: Abnormal   Collection Time: 03/15/22 10:56 AM  Result Value Ref  Range   T3, Free 5.7 (H) 2.0 - 4.4 pg/mL  TSH     Status: None  Collection Time: 04/19/22  8:48 AM  Result Value Ref Range   TSH 1.630 0.450 - 4.500 uIU/mL  T4, free     Status: Abnormal   Collection Time: 04/19/22  8:48 AM  Result Value Ref Range   Free T4 0.40 (L) 0.82 - 1.77 ng/dL  T3, free     Status: Abnormal   Collection Time: 04/19/22  8:48 AM  Result Value Ref Range   T3, Free 1.1 (L) 2.0 - 4.4 pg/mL    Thyroid uptake and scan on April 19, 2021. FINDINGS: A pyramidal lobe is identified. A cold nodule is seen in the lower pole of the left thyroid lobe.   4 hour I-123 uptake = 58.5% (normal 5-20%)   24 hour I-123 uptake = 63.7% (normal 10-30%)   IMPRESSION: 1. Abnormal increased uptake in the thyroid on 4 hour and 24 imaging consistent with reported history of hyperthyroidism. 2. A cold nodule is seen in the lower pole of the left thyroid lobe. Recommend correlation with ultrasound.  Thyroid ultrasound on April 27, 2021 IMPRESSION: 1. Markedly heterogeneous and potentially hyperemic thyroid, nonspecific though could be seen in the setting of an acute thyroiditis. Clinical correlation is advised. 2. Nodule labeled #6, likely correlating with the cold nodule seen on preceding nuclear medicine thyroid uptake and scan, meets imaging criteria to recommend percutaneous sampling as indicated. 3. None of the additional discretely measured thyroid nodules/pseudo nodules meet imaging criteria to recommend percutaneous sampling or continued dedicated follow-up.  01/27/22 RADIOPHARMACEUTICALS:  16.35 mCi I-131 sodium iodide orally   IMPRESSION: Per oral administration of I-131 sodium iodide for the treatment of hyperthyroidism.   Assessment & Plan:   1.  RAI induced hypothyroidism-new diagnosis  2.  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 She presents with her Spanish language interpreter Betty Ellis and her husband.   From previous  visits.  I reviewed and discussed her treatment progress and outcomes with the family as well as her interpreter.  She now presents with clinical evidence of RAI induced hypothyroidism.  It is time to initiate thyroid hormone replacement for her.  I discussed and initiated levothyroxine 50 mcg p.o. daily before breakfast.   - We discussed about the correct intake of her thyroid hormone, on empty stomach at fasting, with water, separated by at least 30 minutes from breakfast and other medications,  and separated by more than 4 hours from calcium, iron, multivitamins, acid reflux medications (PPIs). -Patient is made aware of the fact that thyroid hormone replacement is needed for life, dose to be adjusted by periodic monitoring of thyroid function tests.  She was recently taken off of her beta-blocker.  For her vitamin D deficiency: I discussed and prescribed vitamin D 3 5000 units daily x90 days.   -Patient is advised to maintain close follow up with Jake Samples, PA-C for primary care needs.  She is advised to continue her metformin 500 mg p.o. twice daily for diabetes.  I spent  22  minutes in the care of the patient today including review of labs from Thyroid Function, CMP, and other relevant labs ; imaging/biopsy records (current and previous including abstractions from other facilities); face-to-face time discussing  her lab results and symptoms, medications doses, her options of short and long term treatment based on the latest standards of care / guidelines;   and documenting the encounter.  Betty Ellis  , her husband, and her Spanish language interpreter Betty Ellis participated in the discussions, expressed  understanding, and voiced agreement with the above plans.  All questions were answered to her satisfaction. she is encouraged to contact clinic should she have any questions or concerns prior to her return visit.    Follow up plan: Return in about 3 months (around 07/27/2022)  for F/U with Pre-visit Labs.   Thank you for involving me in the care of this pleasant patient, and I will continue to update you with her progress.  Glade Lloyd, MD [facility] Phone: [phone]  Fax: [phone]   04/28/2022, 12:21 PM  This note was partially dictated with voice recognition software. Similar sounding words can be transcribed inadequately or may not  be corrected upon review.

## 2022-05-04 DIAGNOSIS — E782 Mixed hyperlipidemia: Secondary | ICD-10-CM | POA: Diagnosis not present

## 2022-05-04 DIAGNOSIS — E7849 Other hyperlipidemia: Secondary | ICD-10-CM | POA: Diagnosis not present

## 2022-05-04 DIAGNOSIS — Z6821 Body mass index (BMI) 21.0-21.9, adult: Secondary | ICD-10-CM | POA: Diagnosis not present

## 2022-05-04 DIAGNOSIS — E119 Type 2 diabetes mellitus without complications: Secondary | ICD-10-CM | POA: Diagnosis not present

## 2022-05-04 DIAGNOSIS — E0590 Thyrotoxicosis, unspecified without thyrotoxic crisis or storm: Secondary | ICD-10-CM | POA: Diagnosis not present

## 2022-07-19 DIAGNOSIS — E890 Postprocedural hypothyroidism: Secondary | ICD-10-CM | POA: Diagnosis not present

## 2022-07-20 LAB — TSH: TSH: 23.9 u[IU]/mL — ABNORMAL HIGH (ref 0.450–4.500)

## 2022-07-20 LAB — T4, FREE: Free T4: 1.12 ng/dL (ref 0.82–1.77)

## 2022-07-27 ENCOUNTER — Encounter: Payer: Self-pay | Admitting: "Endocrinology

## 2022-07-27 ENCOUNTER — Ambulatory Visit (INDEPENDENT_AMBULATORY_CARE_PROVIDER_SITE_OTHER): Payer: BC Managed Care – PPO | Admitting: "Endocrinology

## 2022-07-27 VITALS — BP 108/64 | HR 56 | Ht 63.0 in | Wt 131.0 lb

## 2022-07-27 DIAGNOSIS — E890 Postprocedural hypothyroidism: Secondary | ICD-10-CM | POA: Diagnosis not present

## 2022-07-27 MED ORDER — LEVOTHYROXINE SODIUM 75 MCG PO TABS: 75.0000 ug | ORAL_TABLET | Freq: Every day | ORAL | 1 refills | Status: DC

## 2022-07-27 NOTE — Progress Notes (Signed)
07/27/2022      Endocrinology follow-up note Betty Ellis is  accompanied by her Spanish language interpreterMardene Celeste.  Subjective:    Patient ID: Betty Ellis, female    DOB: 09-Aug-1965, PCP Avis Epley, PA-C.   Past Medical History:  Diagnosis Date   Acid reflux    Cancer (HCC)    stomach, kidney   Diabetes mellitus without complication (HCC)    Diverticulitis    Hyperthyroidism     History reviewed. No pertinent surgical history.  Social History   Socioeconomic History   Marital status: Married    Spouse name: Not on file   Number of children: Not on file   Years of education: Not on file   Highest education level: Not on file  Occupational History   Not on file  Tobacco Use   Smoking status: Never   Smokeless tobacco: Never  Vaping Use   Vaping Use: Never used  Substance and Sexual Activity   Alcohol use: No   Drug use: No   Sexual activity: Yes    Birth control/protection: None  Other Topics Concern   Not on file  Social History Narrative   Not on file   Social Determinants of Health   Financial Resource Strain: Not on file  Food Insecurity: Not on file  Transportation Needs: Not on file  Physical Activity: Not on file  Stress: Not on file  Social Connections: Not on file    Family History  Problem Relation Age of Onset   Diabetes Mother    Cancer Father        lung cancer   Cancer Brother        lung cancer    Outpatient Encounter Medications as of 07/27/2022  Medication Sig   atorvastatin (LIPITOR) 10 MG tablet Take 10 mg by mouth daily.   COLLAGEN PO Take 2 tablets by mouth daily.   levothyroxine (SYNTHROID) 75 MCG tablet Take 1 tablet (75 mcg total) by mouth daily before breakfast.   lisinopril (ZESTRIL) 2.5 MG tablet Take 2.5 mg by mouth daily.   MAGNESIUM CITRATE PO Take by mouth.   metFORMIN (GLUCOPHAGE) 500 MG tablet 1 tablet 2 (two) times daily.   OVER THE COUNTER MEDICATION Fenugreek 2 capsules before each  meal   OVER THE COUNTER MEDICATION Active carbon 2 tablet before each meal   [DISCONTINUED] levothyroxine (SYNTHROID) 50 MCG tablet Take 1 tablet (50 mcg total) by mouth daily before breakfast.   No facility-administered encounter medications on file as of 07/27/2022.    ALLERGIES: Allergies  Allergen Reactions   Fish Allergy Hives   Penicillins Itching and Rash    Has patient had a PCN reaction causing immediate rash, facial/tongue/throat swelling, SOB or lightheadedness with hypotension: Yes Has patient had a PCN reaction causing severe rash involving mucus membranes or skin necrosis: No Has patient had a PCN reaction that required hospitalization No Has patient had a PCN reaction occurring within the last 10 years: No If all of the above answers are "NO", then may proceed with Cephalosporin use.     VACCINATION STATUS:  There is no immunization history on file for this patient.   HPI  Betty Ellis is 57 y.o. female who presents today with a medical history as above. she is being seen in follow-up after she was seen in consultation for hyperthyroidism requested by Avis Epley, PA-C.  Patient speaks only [place].  She is accompanied by her interpreter Mardene Celeste.   After  appropriate workup for hypothyroidism which confirmed primary hypothyroidism due to Graves' disease, she was sent for radioactive iodine thyroid ablation. This treatment was administered in November, January 27, 2022.  She was initiated on levothyroxine during her last visit.  She returns with previsit thyroid function test consistent with slight under replacement.      See notes from her previous visits.  Her previous symptoms of hyperthyroidism have resolved. she denies dysphagia, choking, shortness of breath, no recent voice change.    Her uptake and scan shows 63% uptake, however cold nodule in the left lobe of her thyroid. she is not very sure about family history of thyroid dysfunction.  She denies  any personal or family history of thyroid malignancy.                            Review of systems  Constitutional: + weight gain, + fatigue, + subjective hyperthermia Eyes: no blurry vision, + xerophthalmia    Objective:    BP 108/64   Pulse (!) 56   Ht 5\' 3"  (1.6 m)   Wt 131 lb (59.4 kg)   LMP 06/07/2015   BMI 23.21 kg/m   Wt Readings from Last 3 Encounters:  07/27/22 131 lb (59.4 kg)  04/28/22 124 lb 3.2 oz (56.3 kg)  03/23/22 116 lb (52.6 kg)                                                Physical exam  Constitutional: Body mass index is 23.21 kg/m., not in acute distress, + anxious state of mind Eyes: PERRLA, EOMI, ++ exophthalmos ENT: moist mucous membranes, +  thyromegaly, no cervical lymphadenopathy   CMP     Component Value Date/Time   NA 135 06/30/2020 0021   K 4.2 06/30/2020 0021   CL 100 06/30/2020 0021   CO2 25 06/30/2020 0021   GLUCOSE 180 (H) 06/30/2020 0021   BUN 12 06/30/2020 0021   CREATININE 0.44 06/30/2020 0021   CALCIUM 10.1 06/30/2020 0021   PROT 7.5 06/15/2020 0750   ALBUMIN 3.9 06/15/2020 0750   AST 13 (L) 06/15/2020 0750   ALT 19 06/15/2020 0750   ALKPHOS 85 06/15/2020 0750   BILITOT 0.5 06/15/2020 0750   GFRNONAA >60 06/30/2020 0021   GFRAA >60 06/17/2015 2012     CBC    Component Value Date/Time   WBC 17.8 (H) 06/30/2020 0021   RBC 4.82 06/30/2020 0021   HGB 13.2 06/30/2020 0021   HCT 39.8 06/30/2020 0021   PLT 307 06/30/2020 0021   MCV 82.6 06/30/2020 0021   MCH 27.4 06/30/2020 0021   MCHC 33.2 06/30/2020 0021   RDW 13.6 06/30/2020 0021   LYMPHSABS 1.4 06/30/2020 0021   MONOABS 1.0 06/30/2020 0021   EOSABS 0.0 06/30/2020 0021   BASOSABS 0.0 06/30/2020 0021    December April 2022 labs: TSH <0.005, total T4 16.6, free thyroxine index 8.1 elevated 25-hydroxy vitamin D 17.3. Recent Results (from the past 2160 hour(s))  TSH     Status: Abnormal   Collection Time: 07/19/22  8:38 AM  Result Value Ref Range   TSH  23.900 (H) 0.450 - 4.500 uIU/mL  T4, free     Status: None   Collection Time: 07/19/22  8:38 AM  Result Value Ref Range   Free T4  1.12 0.82 - 1.77 ng/dL    Thyroid uptake and scan on April 19, 2021. FINDINGS: A pyramidal lobe is identified. A cold nodule is seen in the lower pole of the left thyroid lobe.   4 hour I-123 uptake = 58.5% (normal 5-20%)   24 hour I-123 uptake = 63.7% (normal 10-30%)   IMPRESSION: 1. Abnormal increased uptake in the thyroid on 4 hour and 24 imaging consistent with reported history of hyperthyroidism. 2. A cold nodule is seen in the lower pole of the left thyroid lobe. Recommend correlation with ultrasound.  Thyroid ultrasound on April 27, 2021 IMPRESSION: 1. Markedly heterogeneous and potentially hyperemic thyroid, nonspecific though could be seen in the setting of an acute thyroiditis. Clinical correlation is advised. 2. Nodule labeled #6, likely correlating with the cold nodule seen on preceding nuclear medicine thyroid uptake and scan, meets imaging criteria to recommend percutaneous sampling as indicated. 3. None of the additional discretely measured thyroid nodules/pseudo nodules meet imaging criteria to recommend percutaneous sampling or continued dedicated follow-up.  01/27/22 RADIOPHARMACEUTICALS:  16.35 mCi I-131 sodium iodide orally   IMPRESSION: Per oral administration of I-131 sodium iodide for the treatment of hyperthyroidism.   Assessment & Plan:   1.  RAI induced hypothyroidism-new diagnosis  2.  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 She presents with her Spanish language interpreter Mardene Celeste.     Her previsit thyroid function tests are consistent with under replacement.  I discussed and increase her levothyroxine to 75 mcg p.o. daily before breakfast.   - We discussed about the correct intake of her thyroid hormone, on empty stomach at fasting, with water, separated by at least 30  minutes from breakfast and other medications,  and separated by more than 4 hours from calcium, iron, multivitamins, acid reflux medications (PPIs). -Patient is made aware of the fact that thyroid hormone replacement is needed for life, dose to be adjusted by periodic monitoring of thyroid function tests.   She was recently taken off of her beta-blocker.  For her vitamin D deficiency: I discussed and prescribed vitamin D 3 5000 units daily x90 days.   -Patient is advised to maintain close follow up with Avis Epley, PA-C for primary care needs.  She is advised to continue her metformin 500 mg p.o. twice daily for diabetes.   I spent  20  minutes in the care of the patient today including review of labs from Thyroid Function, CMP, and other relevant labs ; imaging/biopsy records (current and previous including abstractions from other facilities); face-to-face time discussing  her lab results and symptoms, medications doses, her options of short and long term treatment based on the latest standards of care / guidelines;   and documenting the encounter.  Betty Ellis  participated in the discussions, expressed understanding, and voiced agreement with the above plans.  All questions were answered to her satisfaction. she is encouraged to contact clinic should she have any questions or concerns prior to her return visit.    Follow up plan: Return in about 6 months (around 01/27/2023) for F/U with Pre-visit Labs.   Thank you for involving me in the care of this pleasant patient, and I will continue to update you with her progress.  Marquis Lunch, MD [facility] Phone: [phone]  Fax: [phone]   07/27/2022, 5:23 PM  This note was partially dictated with voice recognition software. Similar sounding words can be transcribed inadequately or may not  be  corrected upon review.

## 2022-09-13 IMAGING — NM NM THYROID IMAGING W/ UPTAKE MULTI (4&24 HR)
4 series · 4 of 4 positions shown · non-contrast
Comparison: None.

CLINICAL DATA: Hyperthyroidism.

EXAM:
THYROID SCAN AND UPTAKE - 4 AND 24 HOURS
TECHNIQUE: Following oral administration of C-M2I capsule, anterior planar
imaging was acquired at 24 hours. Thyroid uptake was calculated with
a thyroid probe at 4-6 hours and 24 hours.
RADIOPHARMACEUTICALS:  Four hundred fifty-eight uCi C-M2I sodium
iodide p.o.

[Series 1: anterior · 1.18mm/px · 1 of 1 slices shown]
[im 1/1]
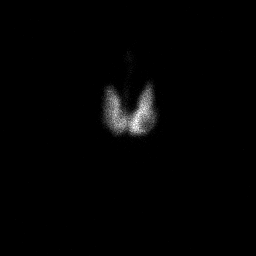

[Series 2: ant w marker · 1.18mm/px · 1 of 1 slices shown]
[im 1/1]
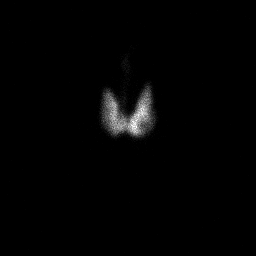

[Series 3: lao · 1.18mm/px · 1 of 1 slices shown]
[im 1/1]
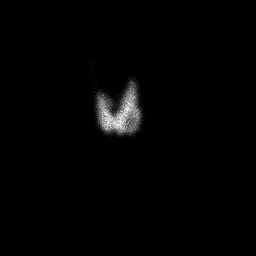

[Series 4: rao · 1.18mm/px · 1 of 1 slices shown]
[im 1/1]
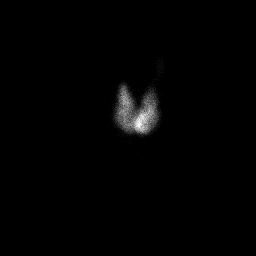

[4 of 4 positions shown; findings below may reference images not displayed]

FINDINGS: A pyramidal lobe is identified. A cold nodule is seen in the lower
pole of the left thyroid lobe.

4 hour C-M2I uptake = 58.5% (normal 5-20%)

24 hour C-M2I uptake = 63.7% (normal 10-30%)
IMPRESSION: 1. Abnormal increased uptake in the thyroid on 4 hour and 24 imaging
consistent with reported history of hyperthyroidism.
2. A cold nodule is seen in the lower pole of the left thyroid lobe.
Recommend correlation with ultrasound.

## 2022-09-22 IMAGING — US US THYROID
1 series · 12 of 25 positions shown · non-contrast
Comparison: Nuclear medicine thyroid uptake and scan-04/19/2021

CLINICAL DATA: Incidental on other study. Cold nodule within the
lower pole of the left lobe of the thyroid. History of
hyperthyroidism.

EXAM:
THYROID ULTRASOUND
TECHNIQUE: Ultrasound examination of the thyroid gland and adjacent soft
tissues was performed.

[Series 1: us thyroid · 0.05mm/px · 12 of 87 slices shown]
[im 4/87]
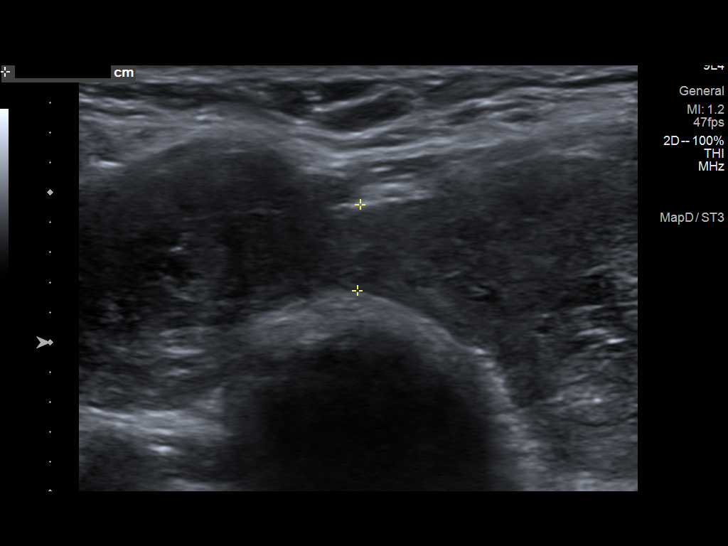
[im 11/87]
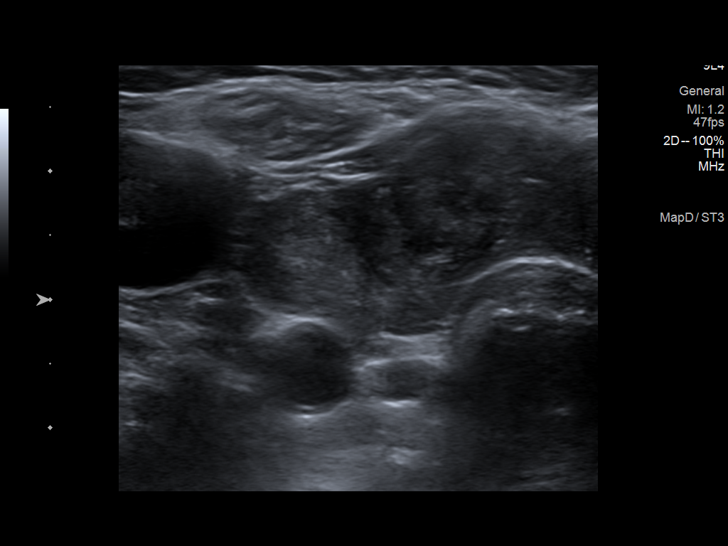
[im 18/87]
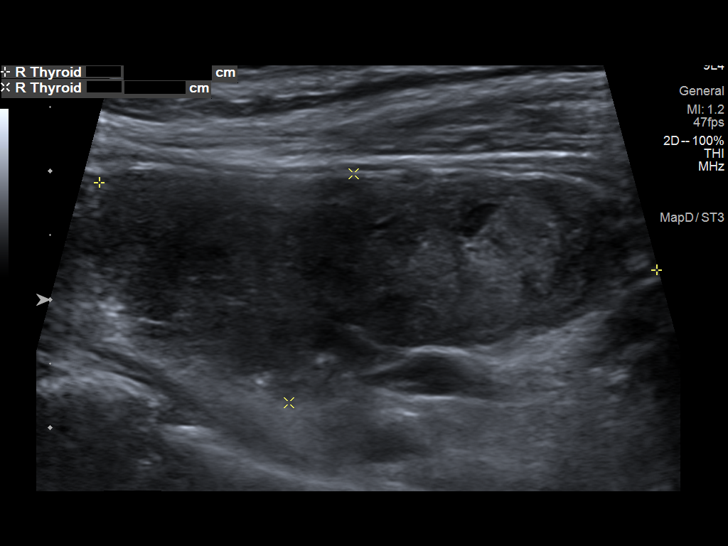
[im 26/87]
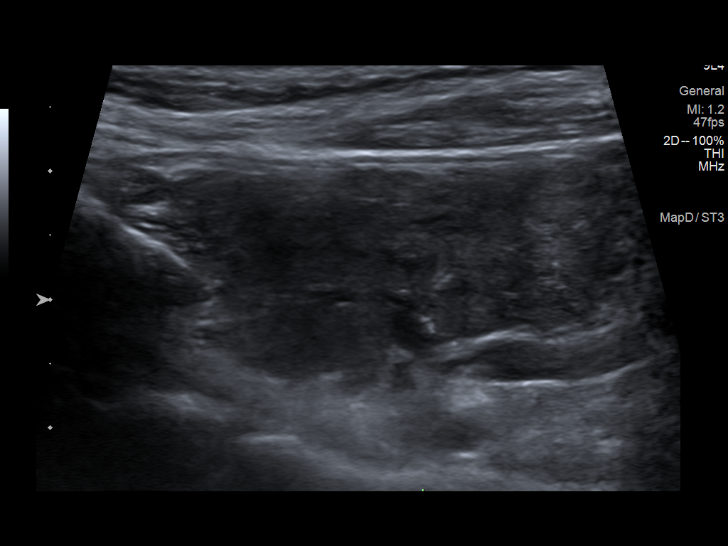
[im 33/87]
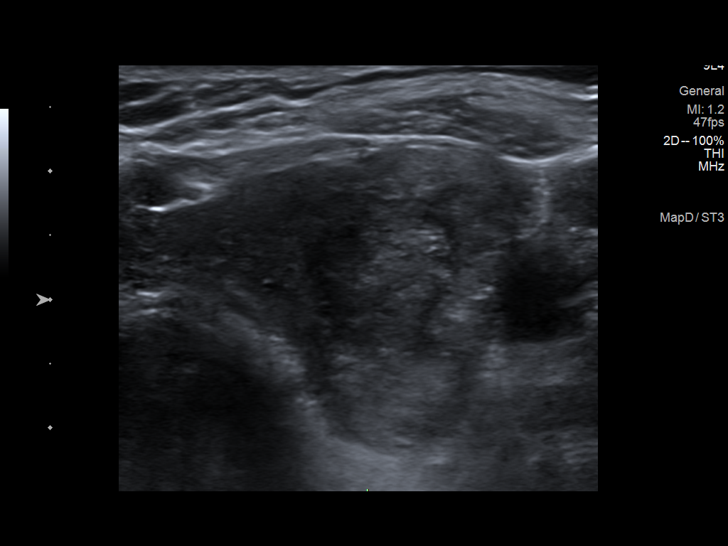
[im 40/87]
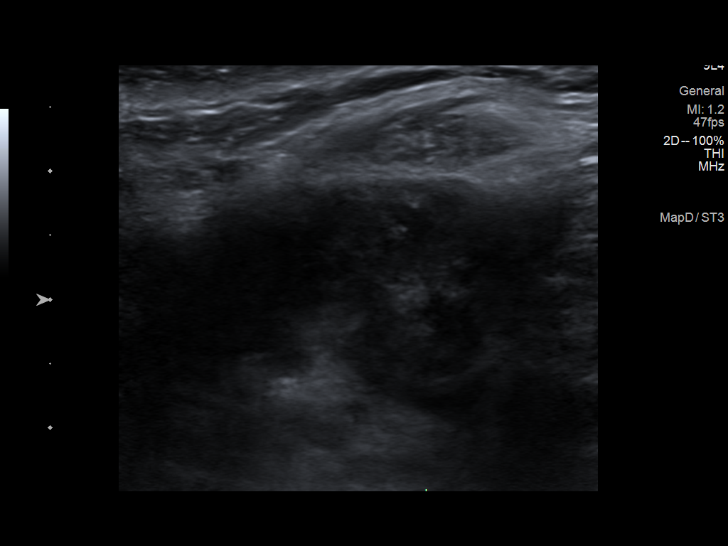
[im 47/87]
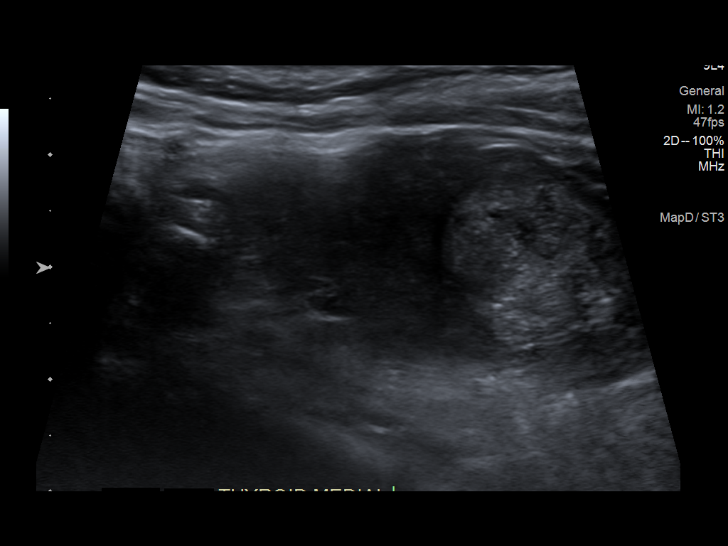
[im 54/87]
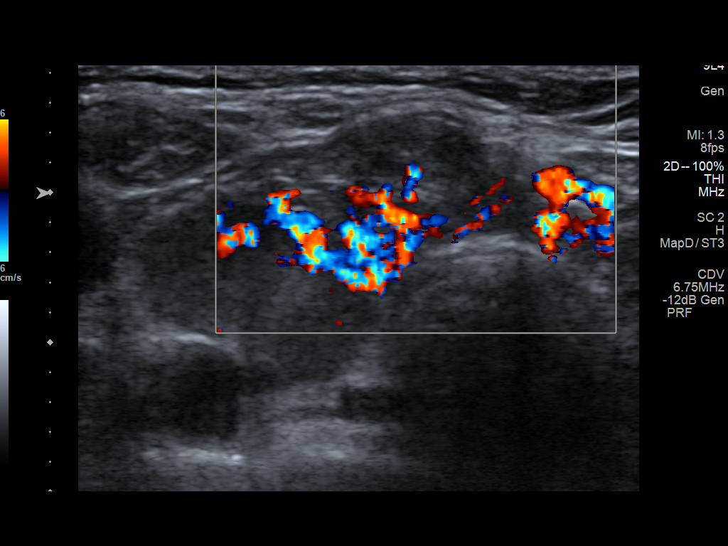
[im 61/87]
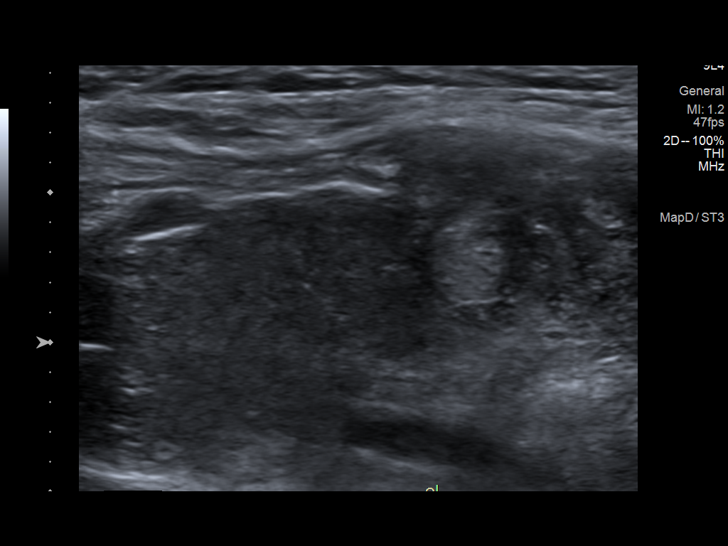
[im 69/87]
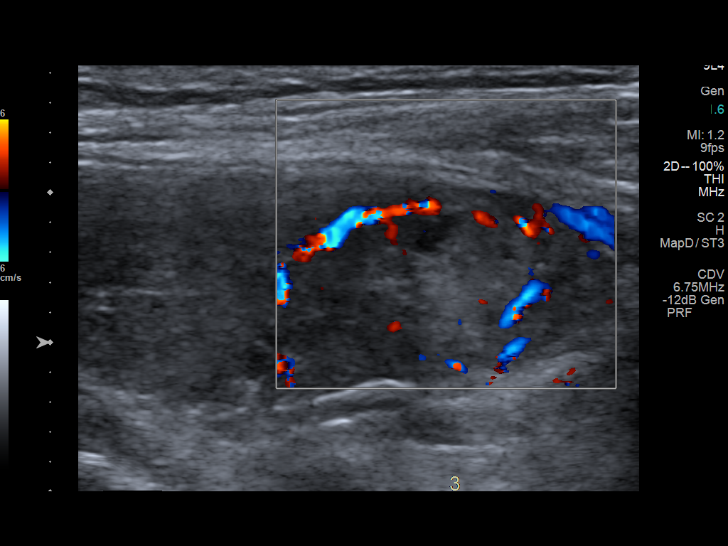
[im 76/87]
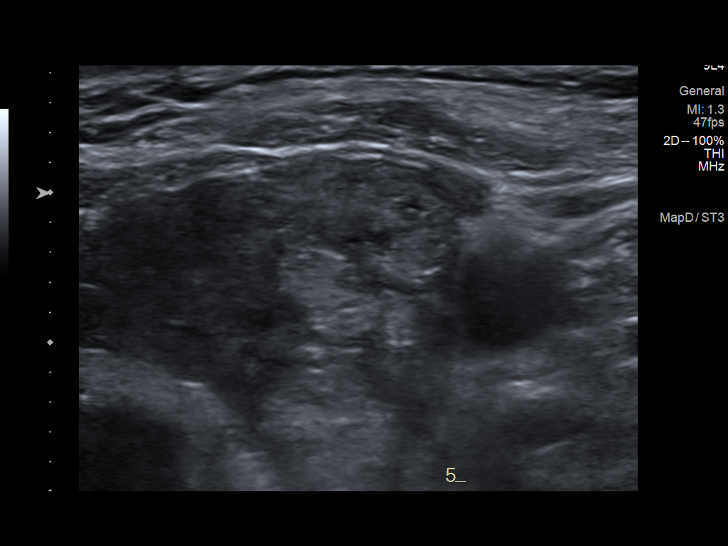
[im 83/87]
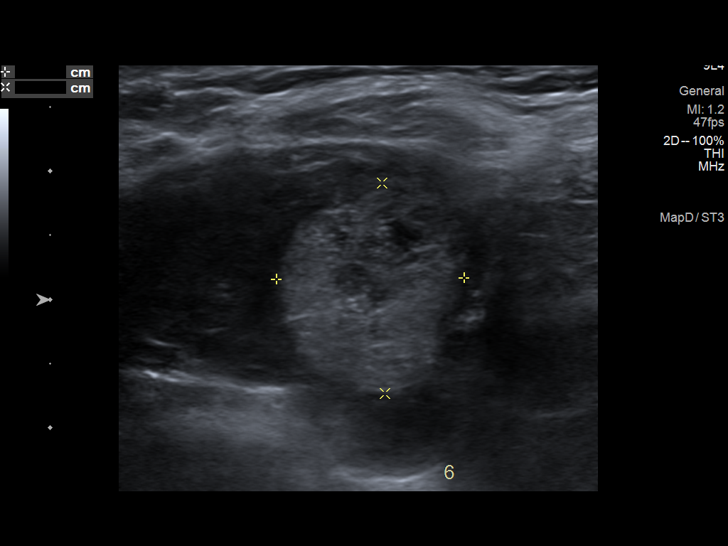

[12 of 25 positions shown; findings below may reference images not displayed]

FINDINGS: Parenchymal Echotexture: Markedly heterogenous - suspected mild
diffuse glandular hyperemia (images 3, 20, 21 and 37)

Isthmus: Normal in size measuring 0.6 cm in diameter

Right lobe: Normal in size measuring 4.4 x 1.9 x 1.8 cm

Left lobe: Normal in size measuring 4.9 x 2.3 x 1.7 cm

_________________________________________________________

Estimated total number of nodules >/= 1 cm: 4

Number of spongiform nodules >/=  2 cm not described below (TR1): 0

Number of mixed cystic and solid nodules >/= 1.5 cm not described
below (TR2): 0

_________________________________________________________

There is an approximately 1.0 x 0.9 x 0.7 cm isoechoic ill-defined
nodule/pseudonodule within the right-sided the thyroid isthmus
(labeled 1), which does not meet imaging criteria to recommend
percutaneous sampling or continued dedicated follow-up.

_________________________________________________________

There is an approximately 0.8 x 0.7 x 0.6 cm hyperechoic ill-defined
nodule/pseudonodule within mid, medial aspect of the right lobe of
the thyroid (labeled 2), which does not meet criteria to recommend
percutaneous sampling or continued dedicated follow-up.

There is an approximately 0.9 x 0.8 x 0.6 cm hyperechoic ill-defined
nodule/pseudonodule within the mid, lateral aspect of the right lobe
of the thyroid (labeled 3), which does not meet imaging criteria to
recommend percutaneous sampling or continued dedicated follow-up.

Questioned 1.3 x 1.3 x 0.9 cm isoechoic nodule/pseudonodule within
the inferior pole the right lobe of the thyroid (labeled 4), does
not meet criteria to recommend percutaneous sampling or continued
dedicated follow-up.

_________________________________________________________

Questioned approximately 1.0 x 0.8 x 0.8 cm isoechoic ill-defined
nodule/pseudonodule within the superior pole the left lobe of the
thyroid (labeled 5), does not meet criteria to recommend
percutaneous sampling or continued dedicated follow-up.

_________________________________________________________

Nodule # 6:

Location: Left; Inferior - likely correlates with the cold nodule
seen on preceding nuclear medicine thyroid uptake and scan

Maximum size: 2.6 cm; Other 2 dimensions: 2.4 x 1.8 cm

Composition: solid/almost completely solid (2)

Echogenicity: hyperechoic (1)

Shape: taller-than-wide (3)

Margins: lobulated/irregular (2)

Echogenic foci: none (0)

ACR TI-RADS total points: 8.

ACR TI-RADS risk category: TR5 (>/= 7 points).

ACR TI-RADS recommendations:

**Given size (>/= 1.0 cm) and appearance, fine needle aspiration of
this highly suspicious nodule should be considered based on TI-RADS
criteria.

_________________________________________________________
IMPRESSION: 1. Markedly heterogeneous and potentially hyperemic thyroid,
nonspecific though could be seen in the setting of an acute
thyroiditis. Clinical correlation is advised.
2. Nodule labeled #6, likely correlating with the cold nodule seen
on preceding nuclear medicine thyroid uptake and scan, meets imaging
criteria to recommend percutaneous sampling as indicated.
3. None of the additional discretely measured thyroid nodules/pseudo
nodules meet imaging criteria to recommend percutaneous sampling or
continued dedicated follow-up.

The above is in keeping with the ACR TI-RADS recommendations - [HOSPITAL] [id number];[DATE].

## 2022-10-05 DIAGNOSIS — Z6822 Body mass index (BMI) 22.0-22.9, adult: Secondary | ICD-10-CM | POA: Diagnosis not present

## 2022-10-05 DIAGNOSIS — R7309 Other abnormal glucose: Secondary | ICD-10-CM | POA: Diagnosis not present

## 2022-10-05 DIAGNOSIS — E7849 Other hyperlipidemia: Secondary | ICD-10-CM | POA: Diagnosis not present

## 2022-10-05 DIAGNOSIS — E0590 Thyrotoxicosis, unspecified without thyrotoxic crisis or storm: Secondary | ICD-10-CM | POA: Diagnosis not present

## 2022-10-05 DIAGNOSIS — E782 Mixed hyperlipidemia: Secondary | ICD-10-CM | POA: Diagnosis not present

## 2022-12-14 DIAGNOSIS — E0590 Thyrotoxicosis, unspecified without thyrotoxic crisis or storm: Secondary | ICD-10-CM | POA: Diagnosis not present

## 2022-12-14 DIAGNOSIS — R011 Cardiac murmur, unspecified: Secondary | ICD-10-CM | POA: Diagnosis not present

## 2022-12-14 DIAGNOSIS — R7309 Other abnormal glucose: Secondary | ICD-10-CM | POA: Diagnosis not present

## 2022-12-14 DIAGNOSIS — Z1331 Encounter for screening for depression: Secondary | ICD-10-CM | POA: Diagnosis not present

## 2022-12-14 DIAGNOSIS — Z6821 Body mass index (BMI) 21.0-21.9, adult: Secondary | ICD-10-CM | POA: Diagnosis not present

## 2022-12-14 DIAGNOSIS — E782 Mixed hyperlipidemia: Secondary | ICD-10-CM | POA: Diagnosis not present

## 2022-12-14 DIAGNOSIS — Z0001 Encounter for general adult medical examination with abnormal findings: Secondary | ICD-10-CM | POA: Diagnosis not present

## 2022-12-14 DIAGNOSIS — E7849 Other hyperlipidemia: Secondary | ICD-10-CM | POA: Diagnosis not present

## 2022-12-14 DIAGNOSIS — E119 Type 2 diabetes mellitus without complications: Secondary | ICD-10-CM | POA: Diagnosis not present

## 2022-12-14 LAB — HEPATIC FUNCTION PANEL
ALT: 14 U/L (ref 7–35)
AST: 17 (ref 13–35)
Alkaline Phosphatase: 74 (ref 25–125)
Bilirubin, Total: 0.4

## 2022-12-14 LAB — BASIC METABOLIC PANEL
BUN: 10 (ref 4–21)
CO2: 26 — AB (ref 13–22)
Chloride: 101 (ref 99–108)
Creatinine: 0.9 (ref 0.5–1.1)
Glucose: 70
Potassium: 4 meq/L (ref 3.5–5.1)
Sodium: 141 (ref 137–147)

## 2022-12-14 LAB — TSH: TSH: 0.28 — AB (ref 0.41–5.90)

## 2022-12-14 LAB — LIPID PANEL
Cholesterol: 121 (ref 0–200)
HDL: 62 (ref 35–70)
LDL Cholesterol: 39
Triglycerides: 109 (ref 40–160)

## 2022-12-14 LAB — COMPREHENSIVE METABOLIC PANEL
Albumin: 4.5 (ref 3.5–5.0)
Calcium: 9.7 (ref 8.7–10.7)
Globulin: 2.3

## 2022-12-15 LAB — LAB REPORT - SCANNED: EGFR: 75

## 2022-12-19 ENCOUNTER — Encounter: Payer: Self-pay | Admitting: "Endocrinology

## 2023-02-06 LAB — TSH: TSH: 4.86 u[IU]/mL — ABNORMAL HIGH (ref 0.450–4.500)

## 2023-02-06 LAB — T4, FREE: Free T4: 1.38 ng/dL (ref 0.82–1.77)

## 2023-02-13 ENCOUNTER — Encounter: Payer: Self-pay | Admitting: "Endocrinology

## 2023-02-13 ENCOUNTER — Ambulatory Visit (INDEPENDENT_AMBULATORY_CARE_PROVIDER_SITE_OTHER): Payer: BC Managed Care – PPO | Admitting: "Endocrinology

## 2023-02-13 VITALS — BP 114/58 | HR 56 | Ht 63.0 in | Wt 122.4 lb

## 2023-02-13 DIAGNOSIS — E890 Postprocedural hypothyroidism: Secondary | ICD-10-CM | POA: Diagnosis not present

## 2023-02-13 MED ORDER — LEVOTHYROXINE SODIUM 75 MCG PO TABS: 75.0000 ug | ORAL_TABLET | Freq: Every day | ORAL | 1 refills | Status: DC

## 2023-02-13 NOTE — Progress Notes (Signed)
02/13/2023      Endocrinology follow-up note Betty Ellis is  accompanied by her Spanish language interpreter- Erie Noe.  Subjective:    Patient ID: Betty Ellis, female    DOB: 04-15-1965, PCP Betty Epley, PA-C.   Past Medical History:  Diagnosis Date   Acid reflux    Cancer (HCC)    stomach, kidney   Diabetes mellitus without complication (HCC)    Diverticulitis    Hyperthyroidism     History reviewed. No pertinent surgical history.  Social History   Socioeconomic History   Marital status: Married    Spouse name: Not on file   Number of children: Not on file   Years of education: Not on file   Highest education level: Not on file  Occupational History   Not on file  Tobacco Use   Smoking status: Never   Smokeless tobacco: Never  Vaping Use   Vaping status: Never Used  Substance and Sexual Activity   Alcohol use: No   Drug use: No   Sexual activity: Yes    Birth control/protection: None  Other Topics Concern   Not on file  Social History Narrative   Not on file   Social Determinants of Health   Financial Resource Strain: Not on file  Food Insecurity: Not on file  Transportation Needs: Not on file  Physical Activity: Not on file  Stress: Not on file  Social Connections: Not on file    Family History  Problem Relation Age of Onset   Diabetes Mother    Cancer Father        lung cancer   Cancer Brother        lung cancer    Outpatient Encounter Medications as of 02/13/2023  Medication Sig   [DISCONTINUED] levothyroxine (SYNTHROID) 50 MCG tablet Take 50 mcg by mouth daily.   atorvastatin (LIPITOR) 10 MG tablet Take 10 mg by mouth daily.   COLLAGEN PO Take 2 tablets by mouth daily.   levothyroxine (SYNTHROID) 75 MCG tablet Take 1 tablet (75 mcg total) by mouth daily.   lisinopril (ZESTRIL) 2.5 MG tablet Take 2.5 mg by mouth daily.   MAGNESIUM CITRATE PO Take by mouth.   metFORMIN (GLUCOPHAGE) 500 MG tablet 1 tablet 2 (two)  times daily.   OVER THE COUNTER MEDICATION Fenugreek 2 capsules before each meal   OVER THE COUNTER MEDICATION Active carbon 2 tablet before each meal   [DISCONTINUED] levothyroxine (SYNTHROID) 75 MCG tablet Take 1 tablet (75 mcg total) by mouth daily before breakfast.   No facility-administered encounter medications on file as of 02/13/2023.    ALLERGIES: Allergies  Allergen Reactions   Fish Allergy Hives   Penicillins Itching and Rash    Has patient had a PCN reaction causing immediate rash, facial/tongue/throat swelling, SOB or lightheadedness with hypotension: Yes Has patient had a PCN reaction causing severe rash involving mucus membranes or skin necrosis: No Has patient had a PCN reaction that required hospitalization No Has patient had a PCN reaction occurring within the last 10 years: No If all of the above answers are "NO", then may proceed with Cephalosporin use.     VACCINATION STATUS:  There is no immunization history on file for this patient.   HPI  Betty Ellis is 57 y.o. female who presents today with a medical history as above. she is being seen in follow-up after she was seen in consultation for hyperthyroidism requested by Betty Epley, PA-C.  Patient speaks only  Spanish.  She is accompanied by her interpreter Betty Ellis.   After appropriate workup for hypothyroidism which confirmed primary hypothyroidism due to Graves' disease, she was sent for radioactive iodine thyroid ablation. This treatment was administered in November, January 27, 2022.  She was initiated on levothyroxine during her last visit.  During her last visit, she was given levothyroxine 75 mcg p.o. daily before breakfast.  In the interim, her dose was changed by other providers to 50 mg.  Her previsit labs are consistent with under replacement.    See notes from her previous visits.  Her previous symptoms of hyperthyroidism have resolved. she denies dysphagia, choking, shortness of breath,  no recent voice change.    Her uptake and scan shows 63% uptake, however cold nodule in the left lobe of her thyroid. she is not very sure about family history of thyroid dysfunction.  She denies any personal or family history of thyroid malignancy.                            Review of systems  Constitutional: + weight gain, + fatigue, + subjective hyperthermia Eyes: no blurry vision, + xerophthalmia    Objective:    BP (!) 114/58   Pulse (!) 56   Ht 5\' 3"  (1.6 m)   Wt 122 lb 6.4 oz (55.5 kg)   LMP 06/07/2015   BMI 21.68 kg/m   Wt Readings from Last 3 Encounters:  02/13/23 122 lb 6.4 oz (55.5 kg)  07/27/22 131 lb (59.4 kg)  04/28/22 124 lb 3.2 oz (56.3 kg)                                                Physical exam  Constitutional: Body mass index is 21.68 kg/m., not in acute distress, + anxious state of mind Eyes: PERRLA, EOMI, ++ exophthalmos ENT: moist mucous membranes, +  thyromegaly, no cervical lymphadenopathy   CMP     Component Value Date/Time   NA 141 12/14/2022 0000   K 4.0 12/14/2022 0000   CL 101 12/14/2022 0000   CO2 26 (A) 12/14/2022 0000   GLUCOSE 180 (H) 06/30/2020 0021   BUN 10 12/14/2022 0000   CREATININE 0.9 12/14/2022 0000   CREATININE 0.44 06/30/2020 0021   CALCIUM 9.7 12/14/2022 0000   PROT 7.5 06/15/2020 0750   ALBUMIN 4.5 12/14/2022 0000   AST 17 12/14/2022 0000   ALT 14 12/14/2022 0000   ALKPHOS 74 12/14/2022 0000   BILITOT 0.5 06/15/2020 0750   GFRNONAA >60 06/30/2020 0021   GFRAA >60 06/17/2015 2012     CBC    Component Value Date/Time   WBC 17.8 (H) 06/30/2020 0021   RBC 4.82 06/30/2020 0021   HGB 13.2 06/30/2020 0021   HCT 39.8 06/30/2020 0021   PLT 307 06/30/2020 0021   MCV 82.6 06/30/2020 0021   MCH 27.4 06/30/2020 0021   MCHC 33.2 06/30/2020 0021   RDW 13.6 06/30/2020 0021   LYMPHSABS 1.4 06/30/2020 0021   MONOABS 1.0 06/30/2020 0021   EOSABS 0.0 06/30/2020 0021   BASOSABS 0.0 06/30/2020 0021    December  April 2022 labs: TSH <0.005, total T4 16.6, free thyroxine index 8.1 elevated 25-hydroxy vitamin D 17.3. Recent Results (from the past 2160 hour(s))  Basic metabolic panel     Status: Abnormal  Collection Time: 12/14/22 12:00 AM  Result Value Ref Range   Glucose 70    BUN 10 4 - 21   CO2 26 (A) 13 - 22   Creatinine 0.9 0.5 - 1.1   Potassium 4.0 3.5 - 5.1 mEq/L   Sodium 141 137 - 147   Chloride 101 99 - 108  Comprehensive metabolic panel     Status: None   Collection Time: 12/14/22 12:00 AM  Result Value Ref Range   Globulin 2.3    Calcium 9.7 8.7 - 10.7   Albumin 4.5 3.5 - 5.0  Lipid panel     Status: None   Collection Time: 12/14/22 12:00 AM  Result Value Ref Range   Triglycerides 109 40 - 160   Cholesterol 121 0 - 200   HDL 62 35 - 70   LDL Cholesterol 39   Hepatic function panel     Status: None   Collection Time: 12/14/22 12:00 AM  Result Value Ref Range   Alkaline Phosphatase 74 25 - 125   ALT 14 7 - 35 U/L   AST 17 13 - 35   Bilirubin, Total 0.4   TSH     Status: Abnormal   Collection Time: 12/14/22 12:00 AM  Result Value Ref Range   TSH 0.28 (A) 0.41 - 5.90    Comment: T4 10.9 T3 uptake 32 Free thyroxine index 3.5  Lab report - scanned     Status: None   Collection Time: 12/15/22  9:01 AM  Result Value Ref Range   EGFR 75.0     Comment: Abstracted by HIM  TSH     Status: Abnormal   Collection Time: 02/05/23  9:11 AM  Result Value Ref Range   TSH 4.860 (H) 0.450 - 4.500 uIU/mL  T4, free     Status: None   Collection Time: 02/05/23  9:11 AM  Result Value Ref Range   Free T4 1.38 0.82 - 1.77 ng/dL    Thyroid uptake and scan on April 19, 2021. FINDINGS: A pyramidal lobe is identified. A cold nodule is seen in the lower pole of the left thyroid lobe.   4 hour I-123 uptake = 58.5% (normal 5-20%)   24 hour I-123 uptake = 63.7% (normal 10-30%)   IMPRESSION: 1. Abnormal increased uptake in the thyroid on 4 hour and 24 imaging consistent with reported  history of hyperthyroidism. 2. A cold nodule is seen in the lower pole of the left thyroid lobe. Recommend correlation with ultrasound.  Thyroid ultrasound on April 27, 2021 IMPRESSION: 1. Markedly heterogeneous and potentially hyperemic thyroid, nonspecific though could be seen in the setting of an acute thyroiditis. Clinical correlation is advised. 2. Nodule labeled #6, likely correlating with the cold nodule seen on preceding nuclear medicine thyroid uptake and scan, meets imaging criteria to recommend percutaneous sampling as indicated. 3. None of the additional discretely measured thyroid nodules/pseudo nodules meet imaging criteria to recommend percutaneous sampling or continued dedicated follow-up.  01/27/22 RADIOPHARMACEUTICALS:  16.35 mCi I-131 sodium iodide orally   IMPRESSION: Per oral administration of I-131 sodium iodide for the treatment of hyperthyroidism.   Assessment & Plan:   1.  RAI induced hypothyroidism-new diagnosis  2.  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 She presents with her Spanish language interpreter - Erie Noe.     Her previsit thyroid function tests are consistent with under replacement.  She would benefit from levothyroxine 75 mcg p.o. daily before breakfast.    -  We discussed about the correct intake of her thyroid hormone, on empty stomach at fasting, with water, separated by at least 30 minutes from breakfast and other medications,  and separated by more than 4 hours from calcium, iron, multivitamins, acid reflux medications (PPIs). -Patient is made aware of the fact that thyroid hormone replacement is needed for life, dose to be adjusted by periodic monitoring of thyroid function tests.   She was recently taken off of her beta-blocker.  For her vitamin D deficiency: She is advised to continue  vitamin D 3 2000 units daily x90 days.   -Patient is advised to maintain close follow up with Betty Epley, PA-C for primary care needs.  She is advised to continue her metformin 500 mg p.o. twice daily for diabetes.   I spent  22  minutes in the care of the patient today including review of labs from Thyroid Function, CMP, and other relevant labs ; imaging/biopsy records (current and previous including abstractions from other facilities); face-to-face time discussing  her lab results and symptoms, medications doses, her options of short and long term treatment based on the latest standards of care / guidelines;   and documenting the encounter.  Hidaya Tak  and her interpreter Erie Noe participated in the discussions, expressed understanding, and voiced agreement with the above plans.  All questions were answered to her satisfaction. she is encouraged to contact clinic should she have any questions or concerns prior to her return visit.    Follow up plan: Return in about 6 months (around 08/13/2023) for F/U with Pre-visit Labs.   Thank you for involving me in the care of this pleasant patient, and I will continue to update you with her progress.  Marquis Lunch, MD [facility] Phone: [phone]  Fax: [phone]   02/13/2023, 9:11 AM  This note was partially dictated with voice recognition software. Similar sounding words can be transcribed inadequately or may not  be corrected upon review.

## 2023-05-22 ENCOUNTER — Ambulatory Visit
Admission: EM | Admit: 2023-05-22 | Discharge: 2023-05-22 | Disposition: A | Discharge status: Home / Self Care | Attending: Nurse Practitioner | Admitting: Nurse Practitioner

## 2023-05-22 DIAGNOSIS — R519 Headache, unspecified: Secondary | ICD-10-CM

## 2023-05-22 DIAGNOSIS — H1132 Conjunctival hemorrhage, left eye: Secondary | ICD-10-CM

## 2023-05-22 MED ORDER — ACETAMINOPHEN 325 MG PO TABS
975.0000 mg | ORAL_TABLET | Freq: Once | ORAL | Status: AC
  Administered 2023-05-22: 975 mg via ORAL

## 2023-05-22 NOTE — ED Notes (Signed)
 Per NP, unable to obtain full visual acuity due to pt not bringing glasses that wears at baseline and requested visual acuity performed by CMA be removed from chart.

## 2023-05-22 NOTE — ED Provider Notes (Signed)
 [facility]    CSN: [id number] Arrival date & time: 05/22/23  1251      History   Chief Complaint No chief complaint on file.   HPI Betty Ellis is a 58 y.o. female.   Patient presents today with left eye redness, photophobia, and headache that began yesterday.  She reports symptoms began after picking up a 42 pack of water bottles.  She denies blurred or double vision or loss of vision of the left eye.  She wears reading glasses for reading but is not prescribed prescription glasses.  She denies contact lens use or foreign body sensation of the left eye.  No eye drainage or itching.  Has not take anything for the headache so far.  She does report history of headaches that she occasionally takes Tylenol for and headache is presenting similarly.  Has not taken anything or tried anything for symptoms so far.  Medical interpreter was used for today's visit. 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 No pertinent surgical history.  OB History   No obstetric history on file.      Home Medications    Prior to Admission medications   Medication Sig Start Date End Date Taking? Authorizing Provider  atorvastatin (LIPITOR) 10 MG tablet Take 10 mg by mouth daily. 05/18/22   [provider]  COLLAGEN PO  Take 2 tablets by mouth daily.    [provider]  levothyroxine (SYNTHROID) 75 MCG tablet Take 1 tablet (75 mcg total) by mouth daily. 02/13/23   Roma Kayser, MD  lisinopril (ZESTRIL) 2.5 MG tablet Take 2.5 mg by mouth daily.    [provider]  MAGNESIUM CITRATE PO Take by mouth.    [provider]  metFORMIN (GLUCOPHAGE) 500 MG tablet 1 tablet 2 (two) times daily. 11/24/21   [provider]  OVER THE COUNTER MEDICATION Fenugreek 2 capsules before each meal    [provider]  OVER THE COUNTER MEDICATION Active carbon 2 tablet before each meal    [provider]    Family History Family History  Problem Relation Age of Onset   Diabetes Mother    Cancer Father        lung cancer   Cancer Brother        lung cancer    Social History Social History   Tobacco Use   Smoking status: Never   Smokeless tobacco: Never  Vaping Use   Vaping status: Never Used  Substance Use Topics   Alcohol use: No   Drug use: No     Allergies   Fish allergy and Penicillins   Review of Systems Review of Systems Per HPI  Physical Exam Triage Vital Signs ED Triage Vitals  Encounter Vitals Group     BP 05/22/23 1418  114/75     Systolic BP Percentile --      Diastolic BP Percentile --      Pulse Rate 05/22/23 1418 (!) 56     Resp 05/22/23 1418 20     Temp 05/22/23 1418 98.7 F (37.1 C)     Temp Source 05/22/23 1418 Oral     SpO2 05/22/23 1418 98 %     Weight --      Height --      Head Circumference --      Peak Flow --      Pain Score 05/22/23 1419 5     Pain Loc --      Pain Education --      Exclude from Growth Chart --    No data found.  Updated Vital Signs BP 114/75 (BP Location: Right Arm)   Pulse (!) 56   Temp 98.7 F (37.1 C) (Oral)   Resp 20   LMP 06/07/2015   SpO2 98%   Visual Acuity Right Eye Distance:   Left Eye Distance:   Bilateral Distance:    Right Eye Near:   Left Eye Near:    Bilateral  Near:     Physical Exam Vitals and nursing note reviewed.  Constitutional:      General: She is not in acute distress.    Appearance: Normal appearance.  HENT:     Head: Normocephalic.     Right Ear: External ear normal.     Left Ear: External ear normal.     Mouth/Throat:     Mouth: Mucous membranes are moist.     Pharynx: Oropharynx is clear.  Eyes:     General: No scleral icterus.       Right eye: No discharge.        Left eye: No discharge.     Extraocular Movements: Extraocular movements intact.     Conjunctiva/sclera:     Left eye: Hemorrhage present.     Pupils: Pupils are equal, round, and reactive to light.  Cardiovascular:     Rate and Rhythm: Normal rate.  Pulmonary:     Effort: Pulmonary effort is normal. No respiratory distress.  Musculoskeletal:     Cervical back: Normal range of motion.  Lymphadenopathy:     Cervical: No cervical adenopathy.  Skin:    General: Skin is warm and dry.     Capillary Refill: Capillary refill takes less than 2 seconds.     Coloration: Skin is not jaundiced or pale.     Findings: No erythema.  Neurological:     Mental Status: She is alert and oriented to person, place, and time.  Psychiatric:        Behavior: Behavior is cooperative.      UC Treatments / Results  Labs (all labs ordered are listed, but only abnormal results are displayed) Labs Reviewed - No data to display  EKG   Radiology No results found.  Procedures Procedures (including critical care time)  Medications Ordered in UC Medications  acetaminophen (TYLENOL) tablet 975 mg (has no administration in time range)    Initial Impression / Assessment and Plan / UC Course  I have reviewed the triage vital signs and the nursing notes.  Pertinent labs & imaging results that were available during my care of the patient were reviewed by me and considered in my medical decision making (see chart for details).   Patient is well-appearing, normotensive,  afebrile, not tachycardic, not tachypneic, oxygenating well on  room air.    1. Subconjunctival hemorrhage of left eye Vitals and exam are reassuring today Visual acuity unable to be performed accurately due to language barrier and patient did not bring glasses per nursing report Reassurance provided regarding subconjunctival hemorrhage Strict ER precautions discussed  2. Acute nonintractable headache, unspecified headache type We discussed that I low suspicion that headache is attributing to subconjunctival hemorrhage Vitals and exam are reassuring today Headache treated with Tylenol in urgent care Strict ER precautions discussed with patient  The patient was given the opportunity to ask questions.  All questions answered to their satisfaction.  The patient is in agreement to this plan.    Final Clinical Impressions(s) / UC Diagnoses   Final diagnoses:  Subconjunctival hemorrhage of left eye  Acute nonintractable headache, unspecified headache type     Discharge Instructions      We gave you Tylenol today for the headache.  You can continue Tylenol at home as needed for the headache.  Please seek care emergently if you develop blurred or double vision or unable to see from the left eye.  The subconjunctival hemorrhage should improve over the next few days.     ED Prescriptions   None    PDMP not reviewed this encounter.   Valentino Nose, NP 05/22/23 [phone]

## 2023-05-22 NOTE — Discharge Instructions (Signed)
 We gave you Tylenol today for the headache.  You can continue Tylenol at home as needed for the headache.  Please seek care emergently if you develop blurred or double vision or unable to see from the left eye.  The subconjunctival hemorrhage should improve over the next few days.

## 2023-05-22 NOTE — ED Triage Notes (Signed)
 Pt reports her left eye is red and painful and she has a bad headache x  1 day

## 2023-05-22 NOTE — ED Notes (Signed)
 Pt states she did not bring reading glasses due to her wearing sunglasses. Could not perform visual acuity.

## 2023-08-06 DIAGNOSIS — E890 Postprocedural hypothyroidism: Secondary | ICD-10-CM | POA: Diagnosis not present

## 2023-08-07 LAB — TSH: TSH: 0.487 u[IU]/mL (ref 0.450–4.500)

## 2023-08-07 LAB — T4, FREE: Free T4: 1.64 ng/dL (ref 0.82–1.77)

## 2023-08-09 ENCOUNTER — Other Ambulatory Visit: Payer: Self-pay | Admitting: "Endocrinology

## 2023-08-14 ENCOUNTER — Ambulatory Visit (INDEPENDENT_AMBULATORY_CARE_PROVIDER_SITE_OTHER): Payer: BC Managed Care – PPO | Admitting: "Endocrinology

## 2023-08-14 ENCOUNTER — Encounter: Payer: Self-pay | Admitting: "Endocrinology

## 2023-08-14 VITALS — BP 108/66 | HR 52 | Ht 63.0 in | Wt 127.8 lb

## 2023-08-14 DIAGNOSIS — E890 Postprocedural hypothyroidism: Secondary | ICD-10-CM

## 2023-08-14 MED ORDER — LEVOTHYROXINE SODIUM 75 MCG PO TABS: 75.0000 ug | ORAL_TABLET | Freq: Every day | ORAL | 1 refills | Status: DC

## 2023-08-14 NOTE — Progress Notes (Signed)
 08/14/2023      Endocrinology follow-up note Betty Ellis is  accompanied by her Spanish language interpreter-  [name].  Subjective:    Patient ID: Betty Ellis, female    DOB: 1965/08/03, PCP Roxene Cora, PA-C.   Past Medical History:  Diagnosis Date   Acid reflux    Cancer (HCC)    stomach, kidney   Diabetes mellitus without complication (HCC)    Diverticulitis    Hyperthyroidism     History reviewed. No pertinent surgical history.  Social History   Socioeconomic History   Marital status: Married    Spouse name: Not on file   Number of children: Not on file   Years of education: Not on file   Highest education level: Not on file  Occupational History   Not on file  Tobacco Use   Smoking status: Never   Smokeless tobacco: Never  Vaping Use   Vaping status: Never Used  Substance and Sexual Activity   Alcohol use: No   Drug use: No   Sexual activity: Yes    Birth control/protection: None  Other Topics Concern   Not on file  Social History Narrative   Not on file   Social Drivers of Health   Financial Resource Strain: Not on file  Food Insecurity: Not on file  Transportation Needs: Not on file  Physical Activity: Not on file  Stress: Not on file  Social Connections: Not on file    Family History  Problem Relation Age of Onset   Diabetes Mother    Cancer Father        lung cancer   Cancer Brother        lung cancer    Outpatient Encounter Medications as of 08/14/2023  Medication Sig   atorvastatin (LIPITOR) 10 MG tablet Take 10 mg by mouth daily.   COLLAGEN PO Take 2 tablets by mouth daily.   levothyroxine  (SYNTHROID ) 75 MCG tablet Take 1 tablet (75 mcg total) by mouth daily before breakfast.   lisinopril (ZESTRIL) 2.5 MG tablet Take 2.5 mg by mouth daily.   MAGNESIUM CITRATE PO Take by mouth.   metFORMIN (GLUCOPHAGE) 500 MG tablet 1 tablet 2 (two) times daily.   OVER THE COUNTER MEDICATION Fenugreek 2 capsules before each  meal   OVER THE COUNTER MEDICATION Active carbon 2 tablet before each meal   [DISCONTINUED] levothyroxine  (SYNTHROID ) 75 MCG tablet TAKE 1 TABLET(75 MCG) BY MOUTH DAILY   No facility-administered encounter medications on file as of 08/14/2023.    ALLERGIES: Allergies  Allergen Reactions   Fish Allergy Hives   Penicillins Itching and Rash    Has patient had a PCN reaction causing immediate rash, facial/tongue/throat swelling, SOB or lightheadedness with hypotension: Yes Has patient had a PCN reaction causing severe rash involving mucus membranes or skin necrosis: No Has patient had a PCN reaction that required hospitalization No Has patient had a PCN reaction occurring within the last 10 years: No If all of the above answers are "NO", then may proceed with Cephalosporin use.     VACCINATION STATUS:  There is no immunization history on file for this patient.   HPI  Betty Ellis is 58 y.o. female who presents today with a medical history as above. she is being seen in follow-up after she was seen in consultation for hyperthyroidism requested by Roxene Cora, PA-C.  Patient speaks only [place].  She is accompanied by her interpreter [name]   After appropriate workup for  hypothyroidism which confirmed primary hypothyroidism due to Graves' disease, she was sent for radioactive iodine thyroid  ablation. This treatment was administered in November, January 27, 2022.  She was initiated on levothyroxine  during prior visits.  She is currently on levothyroxine  75 mcg p.o. daily before breakfast.  She continues to tolerate this medication.  She has no new complaints today.      Her previsit labs are consistent with under replacement.    See notes from her previous visits.  Her previous symptoms of hyperthyroidism have resolved. she denies dysphagia, choking, shortness of breath, no recent voice change.    Her uptake and scan shows 63% uptake, however cold nodule in the left lobe of  her thyroid . she is not very sure about family history of thyroid  dysfunction.  She denies any personal or family history of thyroid  malignancy.                            Review of systems  Constitutional: + weight gain, + fatigue, + subjective hyperthermia Eyes: no blurry vision, + xerophthalmia    Objective:    BP 108/66   Pulse (!) 52   Ht 5\' 3"  (1.6 m)   Wt 127 lb 12.8 oz (58 kg)   LMP 06/07/2015   BMI 22.64 kg/m   Wt Readings from Last 3 Encounters:  08/14/23 127 lb 12.8 oz (58 kg)  02/13/23 122 lb 6.4 oz (55.5 kg)  07/27/22 131 lb (59.4 kg)                                                Physical exam  Constitutional: Body mass index is 22.64 kg/m., not in acute distress, + anxious state of mind Eyes: PERRLA, EOMI, ++ exophthalmos ENT: moist mucous membranes, +  thyromegaly, no cervical lymphadenopathy   CMP     Component Value Date/Time   NA 141 12/14/2022 0000   K 4.0 12/14/2022 0000   CL 101 12/14/2022 0000   CO2 26 (A) 12/14/2022 0000   GLUCOSE 180 (H) 06/30/2020 0021   BUN 10 12/14/2022 0000   CREATININE 0.9 12/14/2022 0000   CREATININE 0.44 06/30/2020 0021   CALCIUM 9.7 12/14/2022 0000   PROT 7.5 06/15/2020 0750   ALBUMIN 4.5 12/14/2022 0000   AST 17 12/14/2022 0000   ALT 14 12/14/2022 0000   ALKPHOS 74 12/14/2022 0000   BILITOT 0.5 06/15/2020 0750   GFRNONAA >60 06/30/2020 0021   GFRAA >60 06/17/2015 2012     CBC    Component Value Date/Time   WBC 17.8 (H) 06/30/2020 0021   RBC 4.82 06/30/2020 0021   HGB 13.2 06/30/2020 0021   HCT 39.8 06/30/2020 0021   PLT 307 06/30/2020 0021   MCV 82.6 06/30/2020 0021   MCH 27.4 06/30/2020 0021   MCHC 33.2 06/30/2020 0021   RDW 13.6 06/30/2020 0021   LYMPHSABS 1.4 06/30/2020 0021   MONOABS 1.0 06/30/2020 0021   EOSABS 0.0 06/30/2020 0021   BASOSABS 0.0 06/30/2020 0021    December April 2022 labs: TSH <0.005, total T4 16.6, free thyroxine index 8.1 elevated 25-hydroxy vitamin D 17.3. Recent  Results (from the past 2160 hours)  TSH     Status: None   Collection Time: 08/06/23  7:40 AM  Result Value Ref Range   TSH 0.487 0.450 -  4.500 uIU/mL  T4, free     Status: None   Collection Time: 08/06/23  7:40 AM  Result Value Ref Range   Free T4 1.64 0.82 - 1.77 ng/dL    Thyroid  uptake and scan on April 19, 2021. FINDINGS: A pyramidal lobe is identified. A cold nodule is seen in the lower pole of the left thyroid  lobe.   4 hour I-123 uptake = 58.5% (normal 5-20%)   24 hour I-123 uptake = 63.7% (normal 10-30%)   IMPRESSION: 1. Abnormal increased uptake in the thyroid  on 4 hour and 24 imaging consistent with reported history of hyperthyroidism. 2. A cold nodule is seen in the lower pole of the left thyroid  lobe. Recommend correlation with ultrasound.  Thyroid  ultrasound on April 27, 2021 IMPRESSION: 1. Markedly heterogeneous and potentially hyperemic thyroid , nonspecific though could be seen in the setting of an acute thyroiditis. Clinical correlation is advised. 2. Nodule labeled #6, likely correlating with the cold nodule seen on preceding nuclear medicine thyroid  uptake and scan, meets imaging criteria to recommend percutaneous sampling as indicated. 3. None of the additional discretely measured thyroid  nodules/pseudo nodules meet imaging criteria to recommend percutaneous sampling or continued dedicated follow-up.  01/27/22 RADIOPHARMACEUTICALS:  16.35 mCi I-131 sodium iodide orally   IMPRESSION: Per oral administration of I-131 sodium iodide for the treatment of hyperthyroidism.   Assessment & Plan:   1.  RAI induced hypothyroidism-new diagnosis  2.  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 She presents with her Spanish language interpreter -[name].    Her previsit thyroid  function tests are consistent with appropriate replacement.  She is advised to continue levothyroxine  75  mcg p.o. daily before breakfast.   - We  discussed about the correct intake of her thyroid  hormone, on empty stomach at fasting, with water, separated by at least 30 minutes from breakfast and other medications,  and separated by more than 4 hours from calcium, iron, multivitamins, acid reflux medications (PPIs). -Patient is made aware of the fact that thyroid  hormone replacement is needed for life, dose to be adjusted by periodic monitoring of thyroid  function tests.    She was recently taken off of her beta-blocker.  For her vitamin D deficiency: She is advised to continue  vitamin D 3 2000 units daily x90 days.   -Patient is advised to maintain close follow up with Roxene Cora, PA-C for primary care needs.  She is advised to continue her metformin 500 mg p.o. twice daily for diabetes.    I spent  20  minutes in the care of the patient today including review of labs from Thyroid  Function, CMP, and other relevant labs ; imaging/biopsy records (current and previous including abstractions from other facilities); face-to-face time discussing  her lab results and symptoms, medications doses, her options of short and long term treatment based on the latest standards of care / guidelines;   and documenting the encounter.  Chaela Branscum and her Spanish language interpreter [name] participated in the discussions, expressed understanding, and voiced agreement with the above plans.  All questions were answered to her satisfaction. she is encouraged to contact clinic should she have any questions or concerns prior to her return visit.    Follow up plan: Return in about 6 months (around 02/14/2024) for F/U with Pre-visit Labs.   Thank you for involving me in the care of this pleasant patient, and I will continue to update you with her progress.  Kalvin Orf, MD [facility]  [facility] Phone: [phone]  Fax: [phone]   08/14/2023, 12:50 PM  This note was partially dictated with voice  recognition software. Similar sounding words can be transcribed inadequately or may not  be corrected upon review.

## 2023-11-01 DIAGNOSIS — E7849 Other hyperlipidemia: Secondary | ICD-10-CM | POA: Diagnosis not present

## 2023-11-01 DIAGNOSIS — E0590 Thyrotoxicosis, unspecified without thyrotoxic crisis or storm: Secondary | ICD-10-CM | POA: Diagnosis not present

## 2023-11-01 DIAGNOSIS — E782 Mixed hyperlipidemia: Secondary | ICD-10-CM | POA: Diagnosis not present

## 2023-11-01 DIAGNOSIS — Z6823 Body mass index (BMI) 23.0-23.9, adult: Secondary | ICD-10-CM | POA: Diagnosis not present

## 2023-11-01 DIAGNOSIS — R7309 Other abnormal glucose: Secondary | ICD-10-CM | POA: Diagnosis not present

## 2023-11-01 DIAGNOSIS — Z1331 Encounter for screening for depression: Secondary | ICD-10-CM | POA: Diagnosis not present

## 2023-11-01 DIAGNOSIS — Z0001 Encounter for general adult medical examination with abnormal findings: Secondary | ICD-10-CM | POA: Diagnosis not present

## 2024-02-11 ENCOUNTER — Other Ambulatory Visit: Payer: Self-pay | Admitting: "Endocrinology

## 2024-02-11 DIAGNOSIS — E782 Mixed hyperlipidemia: Secondary | ICD-10-CM | POA: Diagnosis not present

## 2024-02-11 DIAGNOSIS — Z7689 Persons encountering health services in other specified circumstances: Secondary | ICD-10-CM | POA: Diagnosis not present

## 2024-02-11 DIAGNOSIS — I10 Essential (primary) hypertension: Secondary | ICD-10-CM | POA: Diagnosis not present

## 2024-02-11 DIAGNOSIS — H5789 Other specified disorders of eye and adnexa: Secondary | ICD-10-CM | POA: Diagnosis not present

## 2024-02-11 DIAGNOSIS — E0590 Thyrotoxicosis, unspecified without thyrotoxic crisis or storm: Secondary | ICD-10-CM | POA: Diagnosis not present

## 2024-02-12 LAB — TSH: TSH: 0.34 — AB (ref 0.41–5.90)

## 2024-02-18 ENCOUNTER — Encounter: Payer: Self-pay | Admitting: "Endocrinology

## 2024-02-18 ENCOUNTER — Ambulatory Visit: Admitting: "Endocrinology

## 2024-02-18 VITALS — BP 104/68 | HR 60 | Ht 63.0 in | Wt 139.6 lb

## 2024-02-18 DIAGNOSIS — E890 Postprocedural hypothyroidism: Secondary | ICD-10-CM | POA: Diagnosis not present

## 2024-02-18 MED ORDER — LEVOTHYROXINE SODIUM 75 MCG PO TABS: 75.0000 ug | ORAL_TABLET | Freq: Every day | ORAL | 1 refills | Status: AC

## 2024-02-18 NOTE — Progress Notes (Signed)
 02/18/2024      Endocrinology follow-up note Betty Ellis is  accompanied by her Spanish language interpreter-Patricia and her husband.   Subjective:    Patient ID: Betty Ellis, female    DOB: 04/20/1965, PCP Stroud, Natalie M, FNP.   Past Medical History:  Diagnosis Date   Acid reflux    Cancer (HCC)    stomach, kidney   Diabetes mellitus without complication (HCC)    Diverticulitis    Hyperthyroidism     History reviewed. No pertinent surgical history.  Social History   Socioeconomic History   Marital status: Married    Spouse name: Not on file   Number of children: Not on file   Years of education: Not on file   Highest education level: Not on file  Occupational History   Not on file  Tobacco Use   Smoking status: Never   Smokeless tobacco: Never  Vaping Use   Vaping status: Never Used  Substance and Sexual Activity   Alcohol use: No   Drug use: No   Sexual activity: Yes    Birth control/protection: None  Other Topics Concern   Not on file  Social History Narrative   Not on file   Social Drivers of Health   Financial Resource Strain: Not on file  Food Insecurity: Not on file  Transportation Needs: Not on file  Physical Activity: Not on file  Stress: Not on file  Social Connections: Not on file    Family History  Problem Relation Age of Onset   Diabetes Mother    Cancer Father        lung cancer   Cancer Brother        lung cancer    Outpatient Encounter Medications as of 02/18/2024  Medication Sig   atorvastatin (LIPITOR) 10 MG tablet Take 10 mg by mouth daily.   COLLAGEN PO Take 2 tablets by mouth daily.   levothyroxine  (SYNTHROID ) 75 MCG tablet Take 1 tablet (75 mcg total) by mouth daily before breakfast.   lisinopril (ZESTRIL) 2.5 MG tablet Take 2.5 mg by mouth daily.   MAGNESIUM CITRATE PO Take by mouth.   metFORMIN (GLUCOPHAGE) 500 MG tablet 1 tablet 2 (two) times daily.   OVER THE COUNTER MEDICATION Fenugreek 2 capsules  before each meal   OVER THE COUNTER MEDICATION Active carbon 2 tablet before each meal   [DISCONTINUED] levothyroxine  (SYNTHROID ) 75 MCG tablet TAKE 1 TABLET(75 MCG) BY MOUTH DAILY   No facility-administered encounter medications on file as of 02/18/2024.    ALLERGIES: Allergies  Allergen Reactions   Fish Allergy Hives   Penicillins Itching and Rash    Has patient had a PCN reaction causing immediate rash, facial/tongue/throat swelling, SOB or lightheadedness with hypotension: Yes Has patient had a PCN reaction causing severe rash involving mucus membranes or skin necrosis: No Has patient had a PCN reaction that required hospitalization No Has patient had a PCN reaction occurring within the last 10 years: No If all of the above answers are NO, then may proceed with Cephalosporin use.     VACCINATION STATUS:  There is no immunization history on file for this patient.   HPI  Betty Ellis is 58 y.o. female who presents today with a medical history as above. she is being seen in follow-up after she was seen in consultation for hyperthyroidism requested by Stroud, Natalie M, FNP.  Patient speaks only Spanish.  She is accompanied by her interpreter that she is and her  husband.     After appropriate workup for hypothyroidism which confirmed primary hypothyroidism due to Graves' disease, she was sent for radioactive iodine thyroid  ablation. This treatment was administered in November, January 27, 2022.  She was initiated on levothyroxine  during prior visits.  She is currently on levothyroxine  75 mcg p.o. daily before breakfast.  She continues to tolerate this medication.  She has no new complaints today.      Her previsit labs are consistent with under replacement.    See notes from her previous visits.  Her previous symptoms of hyperthyroidism have resolved. she denies dysphagia, choking, shortness of breath, no recent voice change.    Her uptake and scan shows 63% uptake, however  cold nodule in the left lobe of her thyroid . she is not very sure about family history of thyroid  dysfunction.  She denies any personal or family history of thyroid  malignancy.                            Review of systems  Constitutional: + Weight gain.    Objective:    BP 104/68   Pulse 60   Ht 5' 3 (1.6 m)   Wt 139 lb 9.6 oz (63.3 kg)   LMP 06/07/2015   BMI 24.73 kg/m   Wt Readings from Last 3 Encounters:  02/18/24 139 lb 9.6 oz (63.3 kg)  08/14/23 127 lb 12.8 oz (58 kg)  02/13/23 122 lb 6.4 oz (55.5 kg)                                                Physical exam  Constitutional: Body mass index is 24.73 kg/m., not in acute distress, + anxious state of mind Eyes: PERRLA, EOMI, + exophthalmos ENT: moist mucous membranes, +  thyromegaly, no cervical lymphadenopathy   CMP     Component Value Date/Time   NA 141 12/14/2022 0000   K 4.0 12/14/2022 0000   CL 101 12/14/2022 0000   CO2 26 (A) 12/14/2022 0000   GLUCOSE 180 (H) 06/30/2020 0021   BUN 10 12/14/2022 0000   CREATININE 0.9 12/14/2022 0000   CREATININE 0.44 06/30/2020 0021   CALCIUM 9.7 12/14/2022 0000   PROT 7.5 06/15/2020 0750   ALBUMIN 4.5 12/14/2022 0000   AST 17 12/14/2022 0000   ALT 14 12/14/2022 0000   ALKPHOS 74 12/14/2022 0000   BILITOT 0.5 06/15/2020 0750   GFRNONAA >60 06/30/2020 0021   GFRAA >60 06/17/2015 2012     CBC    Component Value Date/Time   WBC 17.8 (H) 06/30/2020 0021   RBC 4.82 06/30/2020 0021   HGB 13.2 06/30/2020 0021   HCT 39.8 06/30/2020 0021   PLT 307 06/30/2020 0021   MCV 82.6 06/30/2020 0021   MCH 27.4 06/30/2020 0021   MCHC 33.2 06/30/2020 0021   RDW 13.6 06/30/2020 0021   LYMPHSABS 1.4 06/30/2020 0021   MONOABS 1.0 06/30/2020 0021   EOSABS 0.0 06/30/2020 0021   BASOSABS 0.0 06/30/2020 0021    December April 2022 labs: TSH <0.005, total T4 16.6, free thyroxine index 8.1 elevated 25-hydroxy vitamin D 17.3. Recent Results (from the past 2160 hours)  TSH      Status: Abnormal   Collection Time: 02/11/24 12:00 AM  Result Value Ref Range   TSH 0.34 (A) 0.41 -  5.90    Comment: Free T4 1.87     Thyroid  uptake and scan on April 19, 2021. FINDINGS: A pyramidal lobe is identified. A cold nodule is seen in the lower pole of the left thyroid  lobe.   4 hour I-123 uptake = 58.5% (normal 5-20%)   24 hour I-123 uptake = 63.7% (normal 10-30%)   IMPRESSION: 1. Abnormal increased uptake in the thyroid  on 4 hour and 24 imaging consistent with reported history of hyperthyroidism. 2. A cold nodule is seen in the lower pole of the left thyroid  lobe. Recommend correlation with ultrasound.  Thyroid  ultrasound on April 27, 2021 IMPRESSION: 1. Markedly heterogeneous and potentially hyperemic thyroid , nonspecific though could be seen in the setting of an acute thyroiditis. Clinical correlation is advised. 2. Nodule labeled #6, likely correlating with the cold nodule seen on preceding nuclear medicine thyroid  uptake and scan, meets imaging criteria to recommend percutaneous sampling as indicated. 3. None of the additional discretely measured thyroid  nodules/pseudo nodules meet imaging criteria to recommend percutaneous sampling or continued dedicated follow-up.  01/27/22 RADIOPHARMACEUTICALS:  16.35 mCi I-131 sodium iodide orally   IMPRESSION: Per oral administration of I-131 sodium iodide for the treatment of hyperthyroidism.   Assessment & Plan:   1.  RAI induced hypothyroidism-new diagnosis  2.  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 She presents with her Spanish language interpreter -Avelina.  Her previsit thyroid  function tests are consistent with appropriate replacement.  She is advised to continue  levothyroxine  75  mcg p.o. daily before breakfast.    - We discussed about the correct intake of her thyroid  hormone, on empty stomach at fasting, with water, separated by at least 30 minutes from  breakfast and other medications,  and separated by more than 4 hours from calcium, iron, multivitamins, acid reflux medications (PPIs). -Patient is made aware of the fact that thyroid  hormone replacement is needed for life, dose to be adjusted by periodic monitoring of thyroid  function tests.   She was recently taken off of her beta-blocker.  For her vitamin D deficiency: She is advised to continue  vitamin D 3 2000 units daily x90 days.   I spent  20  minutes in the care of the patient today including review of labs from Thyroid  Function, CMP, and other relevant labs ; imaging/biopsy records (current and previous including abstractions from other facilities); face-to-face time discussing  her lab results and symptoms, medications doses, her options of short and long term treatment based on the latest standards of care / guidelines;   and documenting the encounter.  Betty Ellis  participated in the discussions, expressed understanding, and voiced agreement with the above plans.  All questions were answered to her satisfaction. she is encouraged to contact clinic should she have any questions or concerns prior to her return visit.  -Patient is advised to maintain close follow up with Stroud, Natalie M, FNP for primary care needs.  She is advised to continue her metformin 500 mg p.o. twice daily for diabetes.   Follow up plan: Return in about 6 months (around 08/18/2024) for F/U with Pre-visit Labs.   Thank you for involving me in the care of this pleasant patient, and I will continue to update you with her progress.  Ranny Earl, MD [facility] Phone: [phone]  Fax: [phone]   02/18/2024, 9:19 AM  This note was partially dictated with voice recognition software. Similar sounding words can be transcribed inadequately or may  not  be corrected upon review.

## 2024-08-18 ENCOUNTER — Ambulatory Visit: Admitting: "Endocrinology
